# Patient Record
Sex: Female | Born: 1955 | Race: Black or African American | Hispanic: No | Smoking: Current some day smoker
Health system: Southern US, Community
[De-identification: ages and names within clinical notes are randomized; demographics above are authoritative.]

## PROBLEM LIST (undated history)

## (undated) DIAGNOSIS — Z8619 Personal history of other infectious and parasitic diseases: Secondary | ICD-10-CM

## (undated) DIAGNOSIS — Z86010 Personal history of colonic polyps: Secondary | ICD-10-CM

## (undated) DIAGNOSIS — T7840XA Allergy, unspecified, initial encounter: Secondary | ICD-10-CM

## (undated) DIAGNOSIS — M8580 Other specified disorders of bone density and structure, unspecified site: Principal | ICD-10-CM

## (undated) DIAGNOSIS — I10 Essential (primary) hypertension: Secondary | ICD-10-CM

## (undated) DIAGNOSIS — M1990 Unspecified osteoarthritis, unspecified site: Secondary | ICD-10-CM

## (undated) HISTORY — DX: Essential (primary) hypertension: I10

## (undated) HISTORY — DX: Other specified disorders of bone density and structure, unspecified site: M85.80

## (undated) HISTORY — DX: Unspecified osteoarthritis, unspecified site: M19.90

## (undated) HISTORY — DX: Personal history of colonic polyps: Z86.010

## (undated) HISTORY — DX: Allergy, unspecified, initial encounter: T78.40XA

## (undated) HISTORY — DX: Personal history of other infectious and parasitic diseases: Z86.19

---

## 2000-04-03 ENCOUNTER — Emergency Department (HOSPITAL_COMMUNITY): Admission: EM | Admit: 2000-04-03 | Discharge: 2000-04-03 | Payer: Self-pay | Admitting: Emergency Medicine

## 2004-04-29 ENCOUNTER — Emergency Department (HOSPITAL_COMMUNITY): Admission: EM | Admit: 2004-04-29 | Discharge: 2004-04-29 | Payer: Self-pay | Admitting: Emergency Medicine

## 2005-01-14 HISTORY — PX: HAND SURGERY: SHX662

## 2005-05-17 ENCOUNTER — Ambulatory Visit (HOSPITAL_BASED_OUTPATIENT_CLINIC_OR_DEPARTMENT_OTHER): Admission: RE | Admit: 2005-05-17 | Discharge: 2005-05-17 | Payer: Self-pay | Admitting: Orthopedic Surgery

## 2005-07-03 ENCOUNTER — Ambulatory Visit (HOSPITAL_BASED_OUTPATIENT_CLINIC_OR_DEPARTMENT_OTHER): Admission: RE | Admit: 2005-07-03 | Discharge: 2005-07-03 | Payer: Self-pay | Admitting: Orthopedic Surgery

## 2012-01-08 ENCOUNTER — Encounter (HOSPITAL_COMMUNITY): Payer: Self-pay | Admitting: Adult Health

## 2012-01-08 ENCOUNTER — Emergency Department (HOSPITAL_COMMUNITY): Payer: BC Managed Care – PPO

## 2012-01-08 ENCOUNTER — Emergency Department (HOSPITAL_COMMUNITY)
Admission: EM | Admit: 2012-01-08 | Discharge: 2012-01-08 | Disposition: A | Discharge status: Home / Self Care | Payer: BC Managed Care – PPO | Attending: Emergency Medicine | Admitting: Emergency Medicine

## 2012-01-08 DIAGNOSIS — R059 Cough, unspecified: Secondary | ICD-10-CM | POA: Insufficient documentation

## 2012-01-08 DIAGNOSIS — R197 Diarrhea, unspecified: Secondary | ICD-10-CM

## 2012-01-08 DIAGNOSIS — R112 Nausea with vomiting, unspecified: Secondary | ICD-10-CM | POA: Insufficient documentation

## 2012-01-08 DIAGNOSIS — I10 Essential (primary) hypertension: Secondary | ICD-10-CM | POA: Insufficient documentation

## 2012-01-08 DIAGNOSIS — F17200 Nicotine dependence, unspecified, uncomplicated: Secondary | ICD-10-CM | POA: Insufficient documentation

## 2012-01-08 DIAGNOSIS — R509 Fever, unspecified: Secondary | ICD-10-CM | POA: Insufficient documentation

## 2012-01-08 DIAGNOSIS — R05 Cough: Secondary | ICD-10-CM | POA: Insufficient documentation

## 2012-01-08 LAB — CBC WITH DIFFERENTIAL/PLATELET
Basophils Absolute: 0 10*3/uL (ref 0.0–0.1)
Eosinophils Relative: 3 % (ref 0–5)
HCT: 42.1 % (ref 36.0–46.0)
Hemoglobin: 14.2 g/dL (ref 12.0–15.0)
Lymphocytes Relative: 45 % (ref 12–46)
Lymphs Abs: 2.5 10*3/uL (ref 0.7–4.0)
MCV: 92.1 fL (ref 78.0–100.0)
Monocytes Absolute: 0.5 10*3/uL (ref 0.1–1.0)
Monocytes Relative: 8 % (ref 3–12)
Neutro Abs: 2.4 10*3/uL (ref 1.7–7.7)
WBC: 5.5 10*3/uL (ref 4.0–10.5)

## 2012-01-08 LAB — URINALYSIS, ROUTINE W REFLEX MICROSCOPIC
Bilirubin Urine: NEGATIVE
Hgb urine dipstick: NEGATIVE
Ketones, ur: 15 mg/dL — AB
Specific Gravity, Urine: 1.014 (ref 1.005–1.030)
Urobilinogen, UA: 0.2 mg/dL (ref 0.0–1.0)

## 2012-01-08 LAB — BASIC METABOLIC PANEL
BUN: 12 mg/dL (ref 6–23)
CO2: 26 mEq/L (ref 19–32)
Chloride: 100 mEq/L (ref 96–112)
Creatinine, Ser: 0.74 mg/dL (ref 0.50–1.10)
Glucose, Bld: 108 mg/dL — ABNORMAL HIGH (ref 70–99)

## 2012-01-08 LAB — HEPATIC FUNCTION PANEL
ALT: 11 U/L (ref 0–35)
AST: 18 U/L (ref 0–37)
Alkaline Phosphatase: 60 U/L (ref 39–117)
Bilirubin, Direct: <0.1 mg/dL (ref 0.0–0.3)

## 2012-01-08 LAB — POCT I-STAT TROPONIN I

## 2012-01-08 LAB — LIPASE, BLOOD: Lipase: 25 U/L (ref 11–59)

## 2012-01-08 MED ORDER — PROMETHAZINE HCL 25 MG PO TABS: 25.0000 mg | ORAL_TABLET | Freq: Four times a day (QID) | ORAL | Status: DC | PRN

## 2012-01-08 MED ORDER — SODIUM CHLORIDE 0.9 % IV SOLN
1000.0000 mL | Freq: Once | INTRAVENOUS | Status: AC
  Administered 2012-01-08: 1000 mL via INTRAVENOUS

## 2012-01-08 MED ORDER — ONDANSETRON HCL 4 MG/2ML IJ SOLN
4.0000 mg | Freq: Once | INTRAMUSCULAR | Status: AC
  Administered 2012-01-08: 4 mg via INTRAVENOUS
  Filled 2012-01-08: qty 2

## 2012-01-08 MED ORDER — SODIUM CHLORIDE 0.9 % IV SOLN: 1000.0000 mL | INTRAVENOUS | Status: DC

## 2012-01-08 MED ORDER — HYDROCHLOROTHIAZIDE 25 MG PO TABS: 25.0000 mg | ORAL_TABLET | Freq: Every day | ORAL | Status: DC

## 2012-01-08 NOTE — ED Notes (Addendum)
Presents with abdominal pain, diarrhea and vomiting since Friday associated with fatigue and generalized body aches, SOB and headache and chest pain. Chest pain is described as intermittnent and pin sticking located on left side of chest. She is hypertensive 193/90.

## 2012-01-08 NOTE — ED Provider Notes (Signed)
History    CSN: [id number] Arrival date & time 01/08/12  1802 First MD Initiated Contact with Patient 01/08/12 1827      Chief Complaint  Patient presents with  . Diarrhea  . Fever    HPI Comments: It started Friday.  She had an episode or two of vomiting and diarrhea but then vomited about every 20 minutes on Saturday.  The last time she had any diarrhea was Monday.  They last time she vomited was Monday as well.  She still feels nauseated.  She was having pain in her abdomen friday through sunday but that has gotten better.  She has been able to eat a little bit today but her appetite is still not good.  She has had some intermittent sharp pinprick pain in her chest on the left side.  Patient is a 56 y.o. female presenting with diarrhea and fever. The history is provided by the patient.  Diarrhea The primary symptoms include fever and diarrhea. Primary symptoms do not include dysuria.  The illness is also significant for chills.  Fever Primary symptoms of the febrile illness include fever, cough and diarrhea. Primary symptoms do not include dysuria.    History reviewed. No pertinent past medical history. except HTN (she does not have a doctor or take medications)  History reviewed. No pertinent past surgical history.  History reviewed. No pertinent family history.  History  Substance Use Topics  . Smoking status: Current Every Day Smoker  . Smokeless tobacco: Not on file  . Alcohol Use: No    OB History    Grav Para Term Preterm Abortions TAB SAB Ect Mult Living                  Review of Systems  Constitutional: Positive for fever and chills.  Respiratory: Positive for cough.   Gastrointestinal: Positive for diarrhea.  Genitourinary: Negative for dysuria.  All other systems reviewed and are negative.    Allergies  Review of patient's allergies indicates no known allergies.  Home Medications  No current outpatient prescriptions on file.  BP 193/90  Pulse  78  Temp 98.8 F (37.1 C)  Resp 18  SpO2 100%  Physical Exam  Nursing note and vitals reviewed. Constitutional: She appears well-developed and well-nourished. No distress.  HENT:  Head: Normocephalic and atraumatic.  Right Ear: External ear normal.  Left Ear: External ear normal.  Mouth/Throat: No oropharyngeal exudate.  Eyes: Conjunctivae normal are normal. Right eye exhibits no discharge. Left eye exhibits no discharge. No scleral icterus.  Neck: Neck supple. No tracheal deviation present.  Cardiovascular: Normal rate, regular rhythm and intact distal pulses.   Pulmonary/Chest: Effort normal and breath sounds normal. No stridor. No respiratory distress. She has no wheezes. She has no rales.  Abdominal: Soft. Bowel sounds are normal. She exhibits no distension. There is no tenderness. There is no rebound and no guarding.  Musculoskeletal: She exhibits no edema and no tenderness.  Neurological: She is alert. She has normal strength. No sensory deficit. Cranial nerve deficit:  no gross defecits noted. She exhibits normal muscle tone. She displays no seizure activity. Coordination normal.  Skin: Skin is warm and dry. No rash noted.  Psychiatric: She has a normal mood and affect.    ED Course  Procedures (including critical care time)  Medications  0.9 %  sodium chloride infusion (1000 mL Intravenous New Bag/Given 01/08/12 1902)    Followed by  0.9 %  sodium chloride infusion (1000 mL Intravenous New  Bag/Given 01/08/12 1904)    Followed by  0.9 %  sodium chloride infusion (not administered)  Multiple Vitamin (MULTIVITAMIN WITH MINERALS) TABS (not administered)  promethazine (PHENERGAN) 25 MG tablet (not administered)  hydrochlorothiazide (HYDRODIURIL) 25 MG tablet (not administered)  ondansetron (ZOFRAN) injection 4 mg (4 mg Intravenous Given 01/08/12 2021)    Labs Reviewed  BASIC METABOLIC PANEL - Abnormal; Notable for the following:    Glucose, Bld 108 (*)     All other  components within normal limits  URINALYSIS, ROUTINE W REFLEX MICROSCOPIC - Abnormal; Notable for the following:    Ketones, ur 15 (*)     All other components within normal limits  CBC WITH DIFFERENTIAL  LIPASE, BLOOD  HEPATIC FUNCTION PANEL  POCT I-STAT TROPONIN I   Dg Chest 2 View  01/08/2012  *RADIOLOGY REPORT*  Clinical Data: Fever.  Diarrhea.  Chest pain and short of breath  CHEST - 2 VIEW  Comparison:  None.  Findings:  The heart size and mediastinal contours are within normal limits.  Both lungs are clear.  The visualized skeletal structures are unremarkable.  IMPRESSION: No active cardiopulmonary disease.   Original Report Authenticated By: Janeece Riggers, M.D.      1. Diarrhea   2. HTN (hypertension)       MDM  Diarrhea I suspect her symptoms are related to a viral type illness. They have mostly resolved at this point.  She does not have any evidence of dehydration, renal insufficiency, or other abnormalities.  Hypertension The patient's blood pressure is elevated. She does not have a primary care Dr. I will start her on a diuretic but have her start that after her stomach illness has resolved. Patient does have plans to follow up with primary doctor in the next few weeks  At this time there does not appear to be any evidence of an acute emergency medical condition and the patient appears stable for discharge with appropriate outpatient follow up.         Celene Kras, MD 01/08/12 [phone]

## 2012-01-08 NOTE — ED Notes (Signed)
Patient transported to X-ray 

## 2012-01-20 ENCOUNTER — Ambulatory Visit (INDEPENDENT_AMBULATORY_CARE_PROVIDER_SITE_OTHER): Payer: BC Managed Care – PPO | Admitting: Family

## 2012-01-20 ENCOUNTER — Encounter: Payer: Self-pay | Admitting: Family

## 2012-01-20 ENCOUNTER — Ambulatory Visit (HOSPITAL_BASED_OUTPATIENT_CLINIC_OR_DEPARTMENT_OTHER)
Admission: RE | Admit: 2012-01-20 | Discharge: 2012-01-20 | Disposition: A | Discharge status: Home / Self Care | Payer: BC Managed Care – PPO | Source: Ambulatory Visit | Attending: Family | Admitting: Family

## 2012-01-20 ENCOUNTER — Other Ambulatory Visit: Payer: Self-pay | Admitting: Family

## 2012-01-20 VITALS — BP 150/82 | HR 103 | Temp 98.5°F | Resp 16 | Ht 63.0 in | Wt 118.1 lb

## 2012-01-20 DIAGNOSIS — Z0000 Encounter for general adult medical examination without abnormal findings: Secondary | ICD-10-CM

## 2012-01-20 DIAGNOSIS — Z87891 Personal history of nicotine dependence: Secondary | ICD-10-CM | POA: Insufficient documentation

## 2012-01-20 DIAGNOSIS — M79643 Pain in unspecified hand: Secondary | ICD-10-CM

## 2012-01-20 DIAGNOSIS — M79609 Pain in unspecified limb: Secondary | ICD-10-CM

## 2012-01-20 DIAGNOSIS — F17200 Nicotine dependence, unspecified, uncomplicated: Secondary | ICD-10-CM

## 2012-01-20 DIAGNOSIS — Z1231 Encounter for screening mammogram for malignant neoplasm of breast: Secondary | ICD-10-CM

## 2012-01-20 DIAGNOSIS — M2550 Pain in unspecified joint: Secondary | ICD-10-CM

## 2012-01-20 DIAGNOSIS — Z720 Tobacco use: Secondary | ICD-10-CM

## 2012-01-20 DIAGNOSIS — I10 Essential (primary) hypertension: Secondary | ICD-10-CM | POA: Insufficient documentation

## 2012-01-20 LAB — SEDIMENTATION RATE: Sed Rate: 11 mm/hr (ref 0–22)

## 2012-01-20 LAB — BASIC METABOLIC PANEL
CO2: 32 mEq/L (ref 19–32)
Chloride: 101 mEq/L (ref 96–112)
Creat: 0.84 mg/dL (ref 0.50–1.10)
Sodium: 140 mEq/L (ref 135–145)

## 2012-01-20 MED ORDER — LISINOPRIL-HYDROCHLOROTHIAZIDE 10-12.5 MG PO TABS: 1.0000 | ORAL_TABLET | Freq: Every day | ORAL | Status: DC

## 2012-01-20 MED ORDER — VARENICLINE TARTRATE 0.5 MG X 11 & 1 MG X 42 PO MISC: ORAL | Status: DC

## 2012-01-20 NOTE — Addendum Note (Signed)
Addended by: Mervin Kung A on: 01/20/2012 04:08 PM   Modules accepted: Orders

## 2012-01-20 NOTE — Progress Notes (Signed)
Subjective:    Patient ID: Samantha Walker, female    DOB: 01-28-1955, 57 y.o.   MRN: [id number]  HPI  Samantha Walker is a 57 yr old female who presents today to establish care.   Hand pain- Pt reports right hand pain, worse with trying to open a jar. She reports that this mainly bothers her in her hands.  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  Pain worsened 2-3 weeks ago.  HTN- reports that she was diagnosed in her early 70's. She in on HCTZ which she took today.  Tobacco abuse- she has been smoking 1/2 PPD x >20 yrs.  She has quit cold [place] once x 2 weeks.  She has tried the atch but it bothered her skin and she developed itching at the site.      Review of Systems  Constitutional:       Reports that her weight is variable up and down 118-125.  HENT: Negative for hearing loss.   Eyes:       She is overdue for eye exam- wears reading glasses  Respiratory:       Reports + smoker cough  Cardiovascular: Negative for leg swelling.  Gastrointestinal: Negative for nausea, vomiting and diarrhea.  Genitourinary: Negative for dysuria and frequency.  Musculoskeletal: Positive for arthralgias.       + pain in feet/legs at the end of the day from standing.  Skin: Negative for rash.  Neurological:       Reports occasional headaches.  Hematological: Negative for adenopathy.  Psychiatric/Behavioral:       Denies depression.   Some mild anxiety which she attributes to stress   Past Medical History  Diagnosis Date  . Arthritis   . History of chicken pox   . Hypertension     History   Social History  . Marital Status: Married    Spouse Name: N/A    Number of Children: N/A  . Years of Education: N/A   Occupational History  . Not on file.   Social History Main Topics  . Smoking status: Current Every Day Smoker    Types: Cigarettes  . Smokeless tobacco: Not on file     Comment: 8 cigarettes a day  . Alcohol Use: Yes     Comment: occasionally  . Drug Use: Yes   Special: Marijuana  . Sexually Active: Not on file   Other Topics Concern  . Not on file   Social History Narrative   Rare use of marijuanaWorks as cook at [employer] retirement centerShe has 2 grown sons- 4 grandchildrenMarriedCompleted 7th grade.      Past Surgical History  Procedure Date  . Cesarean section 1980  . Hand surgery 2007    carpal tunel release-left hand  . Hand surgery 2007    carpal tunel release--right hand    Family History  Problem Relation Age of Onset  . Arthritis Mother   . Cancer Mother     breast age 62  . Hypertension Mother   . Hypertension Father   . Cancer Sister 13    breast   . Hypertension Sister   . Hypertension Paternal Uncle   . Parkinson's disease Maternal Grandmother     No Known Allergies  Current Outpatient Prescriptions on File Prior to Visit  Medication Sig Dispense Refill  . hydrochlorothiazide (HYDRODIURIL) 25 MG tablet Take 1 tablet (25 mg total) by mouth daily.  30 tablet  0  . lisinopril-hydrochlorothiazide (PRINZIDE,ZESTORETIC)  10-12.5 MG per tablet Take 1 tablet by mouth daily.  30 tablet  1  . promethazine (PHENERGAN) 25 MG tablet Take 1 tablet (25 mg total) by mouth every 6 (six) hours as needed for nausea.  30 tablet  0    BP 150/82  Pulse 103  Temp 98.5 F (36.9 C) (Oral)  Resp 16  Ht 5\' 3"  (1.6 m)  Wt 118 lb 1.9 oz (53.579 kg)  BMI 20.92 kg/m2  SpO2 97%        Objective:   Physical Exam  Constitutional: She is oriented to person, place, and time. She appears well-developed and well-nourished. No distress.       Thin, pleasant, african american female in NAD  HENT:  Head: Normocephalic and atraumatic.  Cardiovascular: Normal rate and regular rhythm.   No murmur heard. Pulmonary/Chest: Effort normal and breath sounds normal. No respiratory distress. She has no wheezes. She has no rales. She exhibits no tenderness.  Musculoskeletal: She exhibits no edema.       + swelling of the right and 1st PIP joint.      Lymphadenopathy:    She has no cervical adenopathy.  Neurological: She is alert and oriented to person, place, and time.  Skin: Skin is warm and dry. No rash noted. No erythema. No pallor.  Psychiatric: She has a normal mood and affect. Her behavior is normal. Judgment and thought content normal.          Assessment & Plan:

## 2012-01-20 NOTE — Assessment & Plan Note (Signed)
Trial of chantix. Common side effects including rare risk of suicide ideation was discussed with the patient today.  Patient is instructed to go directly to the ED if this occurs.  We discussed that patient can continue to smoke for 1 week after starting chantix, but then must discontinue cigarettes.  He is also instructed to contact us prior to completion of the starter month pack for an rx for the continuation month pack.  5 minutes spent with patient today on tobacco cessation counseling.   

## 2012-01-20 NOTE — Assessment & Plan Note (Addendum)
BP is above goal.  Will change hctz to lisinopril hctz. Add ASA 81mg  daily for cardiac protection.

## 2012-01-20 NOTE — Patient Instructions (Addendum)
Please complete your lab work prior to leaving. Please schedule your mammogram on the first floor. Follow up in 1 month for a complete physical. Come fasting to this appointment. Welcome to [facility]!

## 2012-01-20 NOTE — Assessment & Plan Note (Signed)
I suspect OA as cause.  However, will obtain ESR, ANA, RA as well.

## 2012-01-21 ENCOUNTER — Encounter: Payer: Self-pay | Admitting: Family

## 2012-02-18 ENCOUNTER — Encounter: Payer: Self-pay | Admitting: Gastroenterology

## 2012-02-18 ENCOUNTER — Other Ambulatory Visit (HOSPITAL_COMMUNITY)
Admission: RE | Admit: 2012-02-18 | Discharge: 2012-02-18 | Disposition: A | Discharge status: Home / Self Care | Payer: BC Managed Care – PPO | Source: Ambulatory Visit | Attending: Family | Admitting: Family

## 2012-02-18 ENCOUNTER — Encounter: Payer: Self-pay | Admitting: Family

## 2012-02-18 ENCOUNTER — Ambulatory Visit (INDEPENDENT_AMBULATORY_CARE_PROVIDER_SITE_OTHER): Payer: BC Managed Care – PPO | Admitting: Family

## 2012-02-18 VITALS — BP 110/70 | HR 81 | Temp 98.5°F | Resp 16 | Ht 63.0 in | Wt 121.0 lb

## 2012-02-18 DIAGNOSIS — F17200 Nicotine dependence, unspecified, uncomplicated: Secondary | ICD-10-CM

## 2012-02-18 DIAGNOSIS — M79643 Pain in unspecified hand: Secondary | ICD-10-CM

## 2012-02-18 DIAGNOSIS — I10 Essential (primary) hypertension: Secondary | ICD-10-CM

## 2012-02-18 DIAGNOSIS — Z0000 Encounter for general adult medical examination without abnormal findings: Secondary | ICD-10-CM

## 2012-02-18 DIAGNOSIS — M79609 Pain in unspecified limb: Secondary | ICD-10-CM

## 2012-02-18 DIAGNOSIS — Z01419 Encounter for gynecological examination (general) (routine) without abnormal findings: Secondary | ICD-10-CM | POA: Insufficient documentation

## 2012-02-18 DIAGNOSIS — M79644 Pain in right finger(s): Secondary | ICD-10-CM

## 2012-02-18 DIAGNOSIS — Z720 Tobacco use: Secondary | ICD-10-CM

## 2012-02-18 MED ORDER — VARENICLINE TARTRATE 1 MG PO TABS: 1.0000 mg | ORAL_TABLET | Freq: Two times a day (BID) | ORAL | Status: DC

## 2012-02-18 NOTE — Assessment & Plan Note (Signed)
I applauded her success this far, continue chantix.

## 2012-02-18 NOTE — Patient Instructions (Addendum)
You will be contacted about your referral to the hand specialist and for colonoscopy.  Please let us know if you have not heard back within 1 week about your referral. Schedule bone density at the front desk. Complete your lab work prior to leaving.   Follow up in 3 months.

## 2012-02-18 NOTE — Assessment & Plan Note (Signed)
BP is improved.  Continue lisinopril hctz.  Obtain follow up bmet.

## 2012-02-18 NOTE — Assessment & Plan Note (Signed)
Continue healthy diet, exercise, refer for col/dexa.  Pap performed today.  Immunizations reviewed and up to date.

## 2012-02-18 NOTE — Addendum Note (Signed)
Addended by: Mervin Kung A on: 02/18/2012 12:00 PM   Modules accepted: Orders

## 2012-02-18 NOTE — Progress Notes (Signed)
Subjective:    Patient ID: Samantha Walker, female    DOB: 1955-04-23, 57 y.o.   MRN: [id number]  HPI  Samantha Walker is a 57 yr old female who presents today for CPX.  Immunizations: reports tetanus up to date. Flu shot up to date.  ([facility]) Diet: trying to eat healthy.  Exercise: occasional exercise bike.  Colonoscopy: Dexa: due Pap Smear: Mammogram:01/20/12  HTN-  Last visit lisinopril added to regimen.  Tolerating without cough.    Tobacco abuse- on chantix.  Last cigarette 20 days ago.    Review of Systems  Constitutional: Negative for unexpected weight change.  HENT: Negative for hearing loss.   Eyes: Negative for visual disturbance.  Respiratory: Negative for cough.   Cardiovascular: Negative for leg swelling.  Gastrointestinal: Negative for blood in stool.  Genitourinary: Negative for dysuria.  Musculoskeletal: Negative for back pain.       Pain, swelling right index finger, stiff  Skin: Negative for rash.  Neurological: Negative for headaches.  Hematological: Negative for adenopathy.  Psychiatric/Behavioral:       Reports + stress   Past Medical History  Diagnosis Date  . Arthritis   . History of chicken pox   . Hypertension     History   Social History  . Marital Status: Married    Spouse Name: N/A    Number of Children: N/A  . Years of Education: N/A   Occupational History  . Not on file.   Social History Main Topics  . Smoking status: Former Smoker    Types: Cigarettes    Quit date: 01/27/2012  . Smokeless tobacco: Never Used     Comment: 8 cigarettes a day  . Alcohol Use: Yes     Comment: occasionally  . Drug Use: Yes    Special: Marijuana  . Sexually Active: Not on file   Other Topics Concern  . Not on file   Social History Narrative   Rare use of marijuanaWorks as cook at [employer] retirement centerShe has 2 grown sons- 4 grandchildrenMarriedCompleted 7th grade.      Past Surgical History  Procedure Date  . Cesarean section 1980  . Hand  surgery 2007    carpal tunel release-left hand  . Hand surgery 2007    carpal tunel release--right hand    Family History  Problem Relation Age of Onset  . Arthritis Mother   . Cancer Mother     breast age 79  . Hypertension Mother   . Hypertension Father   . Cancer Sister 43    breast   . Hypertension Sister   . Hypertension Paternal Uncle   . Parkinson's disease Maternal Grandmother     No Known Allergies  Current Outpatient Prescriptions on File Prior to Visit  Medication Sig Dispense Refill  . aspirin 81 MG tablet Take 81 mg by mouth daily.      Marland Kitchen lisinopril-hydrochlorothiazide (PRINZIDE,ZESTORETIC) 10-12.5 MG per tablet Take 1 tablet by mouth daily.  30 tablet  1  . Multiple Vitamins-Calcium (ONE-A-DAY WOMENS FORMULA PO) Take 1 tablet by mouth daily.        BP 110/70  Pulse 81  Temp 98.5 F (36.9 C) (Oral)  Resp 16  Ht 5\' 3"  (1.6 m)  Wt 121 lb (54.885 kg)  BMI 21.43 kg/m2  SpO2 99%       Objective:   Physical Exam  Physical Exam  Constitutional: Samantha Walker is oriented to person, place, and time. Samantha Walker appears well-developed and well-nourished. No  distress.  HENT:  Head: Normocephalic and atraumatic.  Right Ear: Tympanic membrane and ear canal normal.  Left Ear: Tympanic membrane and ear canal normal.  Mouth/Throat: Oropharynx is clear and moist.  Eyes: Pupils are equal, round, and reactive to light. No scleral icterus.  Neck: Normal range of motion. No thyromegaly present.  Cardiovascular: Normal rate and regular rhythm.   No murmur heard. Pulmonary/Chest: Effort normal and breath sounds normal. No respiratory distress. He has no wheezes. Samantha Walker has no rales. Samantha Walker exhibits no tenderness.  Abdominal: Soft. Bowel sounds are normal. He exhibits no distension and no mass. There is no tenderness. There is no rebound and no guarding.  Musculoskeletal: Samantha Walker exhibits no edema.  Lymphadenopathy:    Samantha Walker has no cervical adenopathy.  Neurological: Samantha Walker is alert and oriented to  person, place, and time. Samantha Walker has normal patellar reflexes. Samantha Walker exhibits normal muscle tone. Coordination normal.  Skin: Skin is warm and dry.  Psychiatric: Samantha Walker has a normal mood and affect. Her behavior is normal. Judgment and thought content normal.  Breasts: Examined lying Right: Without masses, retractions, discharge or axillary adenopathy.  Left: Without masses, retractions, discharge or axillary adenopathy.  Inguinal/mons: Normal without inguinal adenopathy  External genitalia: Normal  BUS/Urethra/Skene's glands: Normal  Bladder: Normal  Vagina: Normal  Cervix: Normal  Uterus: normal in size, shape and contour. Midline and mobile  Adnexa/parametria:  Rt: Without masses or tenderness.  Lt: Without masses or tenderness.  Anus and perineum: Normal           Assessment & Plan:         Assessment & Plan:

## 2012-02-18 NOTE — Assessment & Plan Note (Signed)
ESR, ANA, Sed rate normal. Refer to hand specialist for further evaluation.

## 2012-02-19 ENCOUNTER — Other Ambulatory Visit: Payer: Self-pay | Admitting: Family

## 2012-02-19 DIAGNOSIS — R739 Hyperglycemia, unspecified: Secondary | ICD-10-CM

## 2012-02-19 LAB — URINALYSIS, ROUTINE W REFLEX MICROSCOPIC
Ketones, ur: NEGATIVE mg/dL
Leukocytes, UA: NEGATIVE
Nitrite: NEGATIVE
Specific Gravity, Urine: 1.014 (ref 1.005–1.030)
Urobilinogen, UA: 0.2 mg/dL (ref 0.0–1.0)

## 2012-02-20 ENCOUNTER — Encounter: Payer: Self-pay | Admitting: Family

## 2012-02-20 LAB — CBC WITH DIFFERENTIAL/PLATELET
Basophils Absolute: 0 10*3/uL (ref 0.0–0.1)
Basophils Relative: 0 % (ref 0–1)
Hemoglobin: 12.3 g/dL (ref 12.0–15.0)
MCHC: 33.6 g/dL (ref 30.0–36.0)
Monocytes Relative: 7 % (ref 3–12)
Neutro Abs: 2.5 10*3/uL (ref 1.7–7.7)
Neutrophils Relative %: 47 % (ref 43–77)
RDW: 13.9 % (ref 11.5–15.5)

## 2012-02-20 LAB — URINALYSIS, ROUTINE W REFLEX MICROSCOPIC
Bilirubin Urine: NEGATIVE
Glucose, UA: NEGATIVE mg/dL
Hgb urine dipstick: NEGATIVE
Protein, ur: NEGATIVE mg/dL

## 2012-02-20 LAB — LIPID PANEL
Cholesterol: 194 mg/dL (ref 0–200)
Triglycerides: 86 mg/dL (ref <150)

## 2012-02-20 LAB — HEPATIC FUNCTION PANEL
ALT: 12 U/L (ref 0–35)
AST: 19 U/L (ref 0–37)
Alkaline Phosphatase: 61 U/L (ref 39–117)
Indirect Bilirubin: 0.5 mg/dL (ref 0.0–0.9)
Total Protein: 7.1 g/dL (ref 6.0–8.3)

## 2012-02-20 LAB — BASIC METABOLIC PANEL WITH GFR
BUN: 11 mg/dL (ref 6–23)
GFR, Est African American: >89 mL/min
GFR, Est Non African American: 81 mL/min
Potassium: 3.6 mEq/L (ref 3.5–5.3)

## 2012-02-22 ENCOUNTER — Encounter: Payer: Self-pay | Admitting: Family

## 2012-03-03 ENCOUNTER — Other Ambulatory Visit: Payer: BC Managed Care – PPO

## 2012-03-11 ENCOUNTER — Telehealth: Payer: Self-pay | Admitting: *Deleted

## 2012-03-11 DIAGNOSIS — R739 Hyperglycemia, unspecified: Secondary | ICD-10-CM | POA: Insufficient documentation

## 2012-03-11 NOTE — Telephone Encounter (Signed)
Message copied by Elnora Morrison on Wed Mar 11, 2012  3:58 PM ------      Message from: Sandford Craze      Created: Sat Feb 22, 2012 11:06 AM             Sodium is slightly low- no change in medication at this time.  Sugar is mildly elevated, blood count normal. Cholesterol liver, thyroid, urine are normal. I would like her to return to the lab for A1C. Diagnosis is hyperglycemia. ------

## 2012-03-11 NOTE — Addendum Note (Signed)
Addended by: Elnora Morrison on: 03/11/2012 04:08 PM   Modules accepted: Orders

## 2012-03-11 NOTE — Telephone Encounter (Signed)
PATIENT NOTIFIED OF LAB RESULTS AND THE NEED TO RETURN TO OFFICE FOR HBA1C.

## 2012-03-18 ENCOUNTER — Ambulatory Visit (INDEPENDENT_AMBULATORY_CARE_PROVIDER_SITE_OTHER)
Admission: RE | Admit: 2012-03-18 | Discharge: 2012-03-18 | Disposition: A | Discharge status: Home / Self Care | Payer: BC Managed Care – PPO | Source: Ambulatory Visit | Attending: Family | Admitting: Family

## 2012-03-18 DIAGNOSIS — Z0000 Encounter for general adult medical examination without abnormal findings: Secondary | ICD-10-CM

## 2012-03-23 ENCOUNTER — Other Ambulatory Visit: Payer: BC Managed Care – PPO | Admitting: Gastroenterology

## 2012-03-26 ENCOUNTER — Encounter: Payer: Self-pay | Admitting: Family

## 2012-03-26 ENCOUNTER — Ambulatory Visit (INDEPENDENT_AMBULATORY_CARE_PROVIDER_SITE_OTHER): Payer: BC Managed Care – PPO | Admitting: Family

## 2012-03-26 VITALS — BP 136/74 | HR 88 | Temp 98.0°F | Resp 16 | Ht 63.0 in | Wt 126.0 lb

## 2012-03-26 DIAGNOSIS — N898 Other specified noninflammatory disorders of vagina: Secondary | ICD-10-CM

## 2012-03-26 DIAGNOSIS — N390 Urinary tract infection, site not specified: Secondary | ICD-10-CM

## 2012-03-26 DIAGNOSIS — N760 Acute vaginitis: Secondary | ICD-10-CM

## 2012-03-26 DIAGNOSIS — B3749 Other urogenital candidiasis: Secondary | ICD-10-CM

## 2012-03-26 LAB — POCT URINALYSIS DIPSTICK
Bilirubin, UA: NEGATIVE
Ketones, UA: NEGATIVE
pH, UA: 6

## 2012-03-26 NOTE — Patient Instructions (Addendum)
We will contact you with your results.  

## 2012-03-26 NOTE — Progress Notes (Signed)
  Subjective:    Patient ID: Samantha Walker, female    DOB: 27-Aug-1955, 57 y.o.   MRN: [id number]  HPI  Samantha Walker is a 57 yr old female who presents today with chief complaint of vaginal discharge. Denies associated abdominal pain or fever.  She reports mild vaginal discomfort.  Feels "a little swollen." denies dysuria. Denies new sexual partners.    Review of Systems See HPI  Past Medical History  Diagnosis Date  . Arthritis   . History of chicken pox   . Hypertension     History   Social History  . Marital Status: Married    Spouse Name: N/A    Number of Children: N/A  . Years of Education: N/A   Occupational History  . Not on file.   Social History Main Topics  . Smoking status: Former Smoker    Types: Cigarettes    Quit date: 01/27/2012  . Smokeless tobacco: Never Used     Comment: 8 cigarettes a day  . Alcohol Use: Yes     Comment: occasionally  . Drug Use: Yes    Special: Marijuana  . Sexually Active: Not on file   Other Topics Concern  . 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            Past Surgical History  Procedure Laterality Date  . Cesarean section  1980  . Hand surgery  2007    carpal tunel release-left hand  . Hand surgery  2007    carpal tunel release--right hand    Family History  Problem Relation Age of Onset  . Arthritis Mother   . Cancer Mother     breast age 50  . Hypertension Mother   . Hypertension Father   . Cancer Sister 7    breast   . Hypertension Sister   . Hypertension Paternal Uncle   . Parkinson's disease Maternal Grandmother     No Known Allergies  Current Outpatient Prescriptions on File Prior to Visit  Medication Sig Dispense Refill  . aspirin 81 MG tablet Take 81 mg by mouth daily.      Marland Kitchen lisinopril-hydrochlorothiazide (PRINZIDE,ZESTORETIC) 10-12.5 MG per tablet Take  1 tablet by mouth daily.  30 tablet  1  . Multiple Vitamins-Calcium (ONE-A-DAY WOMENS FORMULA PO) Take 1 tablet by mouth daily.      . promethazine (PHENERGAN) 25 MG tablet Take 25 mg by mouth as needed.      . varenicline (CHANTIX CONTINUING MONTH PAK) 1 MG tablet Take 1 tablet (1 mg total) by mouth 2 (two) times daily.  60 tablet  1   No current facility-administered medications on file prior to visit.    BP 136/74  Pulse 88  Temp(Src) 98 F (36.7 C) (Oral)  Resp 16  Ht 5\' 3"  (1.6 m)  Wt 126 lb (57.153 kg)  BMI 22.33 kg/m2  SpO2 99%       Objective:   Physical Exam  Constitutional: She appears well-developed and well-nourished. No distress.  Genitourinary: Vagina normal. No vaginal discharge found.  Psychiatric: She has a normal mood and affect. Her behavior is normal. Judgment and thought content normal.          Assessment & Plan:

## 2012-03-27 ENCOUNTER — Telehealth: Payer: Self-pay | Admitting: Family

## 2012-03-27 LAB — URINE CULTURE: Organism ID, Bacteria: NO GROWTH

## 2012-03-27 MED ORDER — METRONIDAZOLE 500 MG PO TABS: 500.0000 mg | ORAL_TABLET | Freq: Two times a day (BID) | ORAL | Status: DC

## 2012-03-27 NOTE — Telephone Encounter (Signed)
Wet prep + for trich and BV. Advised pt to start metronidazole 500mg  bid x 7 days with no ETOH during rx.  Will also treat her husband who is a patient of mine.

## 2012-03-29 DIAGNOSIS — N760 Acute vaginitis: Secondary | ICD-10-CM | POA: Insufficient documentation

## 2012-03-29 NOTE — Assessment & Plan Note (Signed)
Wet prep not BV and Trich  Rx with metronidazole bid x 1week. Partner treated also. See phone note.

## 2012-04-24 ENCOUNTER — Telehealth: Payer: Self-pay | Admitting: *Deleted

## 2012-04-24 MED ORDER — LISINOPRIL-HYDROCHLOROTHIAZIDE 10-12.5 MG PO TABS: 1.0000 | ORAL_TABLET | Freq: Every day | ORAL | Status: DC

## 2012-04-24 NOTE — Telephone Encounter (Signed)
Pt left message requesting refill of lisinopril-hct. Refill sent to [facility], #30 x 1 refill. Notified pt of completion.

## 2012-05-20 ENCOUNTER — Encounter: Payer: Self-pay | Admitting: Family

## 2012-05-20 ENCOUNTER — Ambulatory Visit (INDEPENDENT_AMBULATORY_CARE_PROVIDER_SITE_OTHER): Payer: BC Managed Care – PPO | Admitting: Family

## 2012-05-20 VITALS — BP 138/80 | HR 79 | Temp 97.8°F | Resp 16 | Ht 63.0 in | Wt 128.0 lb

## 2012-05-20 DIAGNOSIS — I10 Essential (primary) hypertension: Secondary | ICD-10-CM

## 2012-05-20 DIAGNOSIS — F17200 Nicotine dependence, unspecified, uncomplicated: Secondary | ICD-10-CM

## 2012-05-20 DIAGNOSIS — R7309 Other abnormal glucose: Secondary | ICD-10-CM

## 2012-05-20 DIAGNOSIS — R739 Hyperglycemia, unspecified: Secondary | ICD-10-CM

## 2012-05-20 DIAGNOSIS — Z720 Tobacco use: Secondary | ICD-10-CM

## 2012-05-20 LAB — BASIC METABOLIC PANEL
Glucose, Bld: 87 mg/dL (ref 70–99)
Potassium: 4 mEq/L (ref 3.5–5.3)
Sodium: 142 mEq/L (ref 135–145)

## 2012-05-20 LAB — HEMOGLOBIN A1C: Hgb A1c MFr Bld: 5.1 % (ref <5.7)

## 2012-05-20 NOTE — Assessment & Plan Note (Signed)
She has quit. I commended her efforts!

## 2012-05-20 NOTE — Patient Instructions (Addendum)
Please schedule a follow up appointment in 3 months.

## 2012-05-20 NOTE — Assessment & Plan Note (Signed)
BP stable on lisinopril/hctz, continue same, obtain bmet.

## 2012-05-20 NOTE — Assessment & Plan Note (Addendum)
New.  Check A1C.

## 2012-05-20 NOTE — Progress Notes (Signed)
Subjective:    Patient ID: Samantha Walker, female    DOB: [birth year], 56 y.o.   MRN: [id number]  HPI  Ms.  Walker is a 57 yr old female who presents today for follow up.  1) HTN-  Currently maintained on lisinopril/hctz.    2) Hyperglycemia- last visit A1C was 134  3) Tobacco abuse- She continues chantix.  Has not smoked since 1/16.      Review of Systems See HPI  Past Medical History  Diagnosis Date  . Arthritis   . History of chicken pox   . Hypertension     History   Social History  . Marital Status: Married    Spouse Name: N/A    Number of Children: N/A  . Years of Education: N/A   Occupational History  . Not on file.   Social History Main Topics  . Smoking status: Former Smoker    Types: Cigarettes    Quit date: 01/27/2012  . Smokeless tobacco: Never Used     Comment: 8 cigarettes a day  . Alcohol Use: Yes     Comment: occasionally  . Drug Use: Yes    Special: Marijuana  . Sexually Active: Not on file   Other Topics Concern  . 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            Past Surgical History  Procedure Laterality Date  . Cesarean section  1980  . Hand surgery  2007    carpal tunel release-left hand  . Hand surgery  2007    carpal tunel release--right hand    Family History  Problem Relation Age of Onset  . Arthritis Mother   . Cancer Mother     breast age 41  . Hypertension Mother   . Hypertension Father   . Cancer Sister 84    breast   . Hypertension Sister   . Hypertension Paternal Uncle   . Parkinson's disease Maternal Grandmother     No Known Allergies  Current Outpatient Prescriptions on File Prior to Visit  Medication Sig Dispense Refill  . aspirin 81 MG tablet Take 81 mg by mouth daily.      Marland Kitchen lisinopril-hydrochlorothiazide (PRINZIDE,ZESTORETIC) 10-12.5 MG per tablet Take 1  tablet by mouth daily.  30 tablet  1  . Multiple Vitamins-Calcium (ONE-A-DAY WOMENS FORMULA PO) Take 1 tablet by mouth daily.      . promethazine (PHENERGAN) 25 MG tablet Take 25 mg by mouth as needed.      . varenicline (CHANTIX CONTINUING MONTH PAK) 1 MG tablet Take 1 tablet (1 mg total) by mouth 2 (two) times daily.  60 tablet  1   No current facility-administered medications on file prior to visit.    BP 138/80  Pulse 79  Temp(Src) 97.8 F (36.6 C) (Oral)  Resp 16  Ht 5\' 3"  (1.6 m)  Wt 128 lb (58.06 kg)  BMI 22.68 kg/m2  SpO2 99%       Objective:   Physical Exam  Constitutional: She is oriented to person, place, and time. She appears well-developed and well-nourished. No distress.  Cardiovascular: Normal rate and regular rhythm.   No murmur heard. Pulmonary/Chest: Effort normal and breath sounds normal. No respiratory distress. She has no wheezes. She has no rales. She exhibits no tenderness.  Neurological: She is alert and oriented to person, place, and time.  Psychiatric: She has a normal mood and affect. Her behavior is normal. Judgment and thought content normal.          Assessment & Plan:

## 2012-06-05 ENCOUNTER — Telehealth: Payer: Self-pay | Admitting: *Deleted

## 2012-06-05 NOTE — Telephone Encounter (Signed)
Message copied by Kathi Simpers on Fri Jun 05, 2012  4:38 PM ------      Message from: O'SULLIVAN, MELISSA      Created: Tue May 26, 2012  9:11 PM       OK, could you pls call pt and let her know that her calcium is slightly elevated.  I would like her to return for those tests please. ------

## 2012-06-05 NOTE — Telephone Encounter (Signed)
Notes Recorded by Kathi Simpers, CMA on 06/05/2012 at 4:37 PM Home # now out of service. Mailed letter. Notes Recorded by Kathi Simpers, CMA on 06/04/2012 at 10:30 AM Left message on voicemail to return my call. Notes Recorded by Sandford Craze, NP on 05/26/2012 at 9:11 PM OK, could you pls call pt and let her know that her calcium is slightly elevated. I would like her to return for those tests please. Notes Recorded by Kathi Simpers, CMA on 05/26/2012 at 5:41 PM Lab will be unable to add PTH and ionized calcium as specimen will have to be frozen. Notes Recorded by Sandford Craze, NP on 05/21/2012 at 9:11 PM Can lab add on intact PTH and ionized calcium please? Dx hypercalcemia.

## 2012-06-30 ENCOUNTER — Telehealth: Payer: Self-pay | Admitting: Family

## 2012-06-30 ENCOUNTER — Encounter: Payer: Self-pay | Admitting: Family

## 2012-06-30 DIAGNOSIS — M8580 Other specified disorders of bone density and structure, unspecified site: Secondary | ICD-10-CM | POA: Insufficient documentation

## 2012-06-30 HISTORY — DX: Other specified disorders of bone density and structure, unspecified site: M85.80

## 2012-06-30 MED ORDER — CALCIUM CARBONATE-VITAMIN D 600-400 MG-UNIT PO TABS: 1.0000 | ORAL_TABLET | Freq: Every day | ORAL | Status: AC

## 2012-06-30 NOTE — Telephone Encounter (Signed)
Pls call pt and let her know that her bone density shows mild bone thinning.  I would like her to add caltrate 600mg  + D bid and make sure to get regular weight bearing exercise.

## 2012-07-01 NOTE — Telephone Encounter (Signed)
Tried calling pt # on file is no longer a working #. Mailing pt letter with md response concerning her bone density...Samantha Walker

## 2012-08-18 ENCOUNTER — Ambulatory Visit: Payer: BC Managed Care – PPO | Admitting: Family

## 2012-08-21 ENCOUNTER — Encounter: Payer: Self-pay | Admitting: Family

## 2012-08-21 ENCOUNTER — Ambulatory Visit (INDEPENDENT_AMBULATORY_CARE_PROVIDER_SITE_OTHER): Payer: BC Managed Care – PPO | Admitting: Family

## 2012-08-21 DIAGNOSIS — Z87891 Personal history of nicotine dependence: Secondary | ICD-10-CM

## 2012-08-21 DIAGNOSIS — I10 Essential (primary) hypertension: Secondary | ICD-10-CM

## 2012-08-21 DIAGNOSIS — R7309 Other abnormal glucose: Secondary | ICD-10-CM

## 2012-08-21 DIAGNOSIS — R739 Hyperglycemia, unspecified: Secondary | ICD-10-CM

## 2012-08-21 LAB — CALCIUM, IONIZED: Calcium, Ion: 1.34 mmol/L — ABNORMAL HIGH (ref 1.12–1.32)

## 2012-08-21 MED ORDER — LISINOPRIL-HYDROCHLOROTHIAZIDE 10-12.5 MG PO TABS: 1.0000 | ORAL_TABLET | Freq: Every day | ORAL | Status: DC

## 2012-08-21 NOTE — Assessment & Plan Note (Signed)
A1C 5.1.  Discussed healthy diet and exercise.  Monitor.

## 2012-08-21 NOTE — Assessment & Plan Note (Signed)
Blood pressure is up today but she has not taken her AM med.  I advised her to continue current meds, take dose prior to next appointment.  BP Readings from Last 3 Encounters:  08/21/12 150/86  05/20/12 138/80  03/26/12 136/74

## 2012-08-21 NOTE — Patient Instructions (Addendum)
Please complete your lab work prior to leaving. Follow up in 3 months.   

## 2012-08-21 NOTE — Progress Notes (Signed)
Subjective:    Patient ID: Samantha Walker, female    DOB: [birth year], 56 y.o.   MRN: [id number]  HPI  [name] is a 57 yr old female who presents today for follow up.  1) HTN- currently maintained on lisinopril-hctz. Has not taken medication this AM.   2) Hyperglycemia-last A1C 5.1.   3) Tobacco abuse- quit in January. She is off of chantix. Still quit.     4) hypercalcemia-  Noted last visit.    Review of Systems  Respiratory: Negative for shortness of breath.   Cardiovascular: Negative for chest pain and leg swelling.   See HPI Past Medical History  Diagnosis Date  . Arthritis   . History of chicken pox   . Hypertension   . Osteopenia 06/30/2012    History   Social History  . Marital Status: Married    Spouse Name: N/A    Number of Children: N/A  . Years of Education: N/A   Occupational History  . Not on file.   Social History Main Topics  . Smoking status: Former Smoker    Types: Cigarettes    Quit date: 01/27/2012  . Smokeless tobacco: Never Used     Comment: 8 cigarettes a day  . Alcohol Use: Yes     Comment: occasionally  . Drug Use: Yes    Special: Marijuana  . Sexually Active: Not on file   Other Topics Concern  . 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            Past Surgical History  Procedure Laterality Date  . Cesarean section  1980  . Hand surgery  2007    carpal tunel release-left hand  . Hand surgery  2007    carpal tunel release--right hand    Family History  Problem Relation Age of Onset  . Arthritis Mother   . Cancer Mother     breast age 53  . Hypertension Mother   . Hypertension Father   . Cancer Sister 98    breast   . Hypertension Sister   . Hypertension Paternal Uncle   . Parkinson's disease Maternal Grandmother     No Known Allergies  Current Outpatient Prescriptions on  File Prior to Visit  Medication Sig Dispense Refill  . aspirin 81 MG tablet Take 81 mg by mouth daily.      . Calcium Carbonate-Vitamin D 600-400 MG-UNIT per tablet Take 1 tablet by mouth daily.      Marland Kitchen lisinopril-hydrochlorothiazide (PRINZIDE,ZESTORETIC) 10-12.5 MG per tablet Take 1 tablet by mouth daily.  30 tablet  1  . Multiple Vitamins-Calcium (ONE-A-DAY WOMENS FORMULA PO) Take 1 tablet by mouth daily.      . promethazine (PHENERGAN) 25 MG tablet Take 25 mg by mouth as needed.       No current facility-administered medications on file prior to visit.    BP 150/86  Pulse 60  Temp(Src) 97.6 F (36.4 C) (Oral)  Resp 16  Wt 129 lb 1.3 oz (58.55 kg)  BMI 22.87 kg/m2  SpO2 98%       Objective:   Physical Exam  Constitutional: She is oriented to person, place, and time. She appears well-developed and well-nourished. No distress.  Cardiovascular: Normal rate and regular rhythm.   No murmur heard. Pulmonary/Chest: Effort  normal and breath sounds normal. No respiratory distress. She has no wheezes. She has no rales. She exhibits no tenderness.  Neurological: She is alert and oriented to person, place, and time.  Psychiatric: She has a normal mood and affect. Her behavior is normal. Judgment and thought content normal.          Assessment & Plan:

## 2012-08-21 NOTE — Assessment & Plan Note (Signed)
I commended her on quitting smoking.

## 2012-08-24 ENCOUNTER — Telehealth: Payer: Self-pay | Admitting: Family

## 2012-08-24 LAB — PARATHYROID HORMONE, INTACT (NO CA): PTH: 41.7 pg/mL (ref 14.0–72.0)

## 2012-08-24 NOTE — Telephone Encounter (Signed)
Please call pt and let her know that her calcium is mildly elevated. I would like her to return for some additional lab work as below.

## 2012-08-25 NOTE — Telephone Encounter (Signed)
Left message on home # to return my call. 

## 2012-08-25 NOTE — Telephone Encounter (Signed)
Pt left message returning my call and states it is ok to leave a detailed on her voicemail. Attempted to reach pt and left detailed message to return to the lab for additional test to evaluate mildly elevated calcium letter and to call if any questions. Lab order completed.

## 2012-08-27 ENCOUNTER — Other Ambulatory Visit: Payer: Self-pay | Admitting: Family

## 2012-08-31 LAB — PROTEIN ELECTROPHORESIS, SERUM
Alpha-2-Globulin: 8.5 % (ref 7.1–11.8)
Total Protein, Serum Electrophoresis: 7.3 g/dL (ref 6.0–8.3)

## 2012-09-04 LAB — IMMUNOFIXATION ELECTROPHORESIS
IgG (Immunoglobin G), Serum: 1120 mg/dL (ref 690–1700)
Total Protein, Serum Electrophoresis: 7.6 g/dL (ref 6.0–8.3)

## 2012-09-07 ENCOUNTER — Telehealth: Payer: Self-pay | Admitting: Family

## 2012-09-07 DIAGNOSIS — R771 Abnormality of globulin: Secondary | ICD-10-CM

## 2012-09-07 NOTE — Telephone Encounter (Signed)
Given abnormal globulin and hypercalcemia- will refer to hematology for further evaluation.  Left message requesting that pt return our call.  Order pended below.

## 2012-09-08 NOTE — Telephone Encounter (Signed)
Pt returned my call and left message that it is ok to leave detailed message on home voicemail.  Left detailed message to call and let us know if she wants to proceed with referral.

## 2012-09-17 NOTE — Telephone Encounter (Signed)
Left message for pt to call with decision re: referral below.

## 2012-09-21 NOTE — Telephone Encounter (Signed)
Pt returned my call and wishes to proceed with referral. Also requests refill of BP medication. Pt gave verbal ok to leave detailed message on voicemail.  Refills were sent on 08/21/12, #30 x 5 refills. Left detailed message on pt's voicemail re: refills and to contact her pharmacy.

## 2012-09-22 ENCOUNTER — Telehealth: Payer: Self-pay | Admitting: Family

## 2012-09-22 ENCOUNTER — Telehealth: Payer: Self-pay | Admitting: Hematology & Oncology

## 2012-09-22 NOTE — Telephone Encounter (Signed)
Message copied by Sandford Craze on Tue Sep 22, 2012 12:23 PM ------      Message from: Eulah Pont      Created: Tue Sep 22, 2012 11:47 AM       Call from [facility] @ Dr Myna Hidalgo,   Dr Myna Hidalgo looked at pt's notes  York Spaniel he did not need to see pt, you can call if you have questions ------

## 2012-09-22 NOTE — Telephone Encounter (Signed)
Helen at referring aware Dr. Myna Hidalgo feels he doesn't need to see patient but would be happy to talk to NP about it.

## 2012-11-18 ENCOUNTER — Encounter: Payer: Self-pay | Admitting: Family

## 2012-11-18 ENCOUNTER — Ambulatory Visit (INDEPENDENT_AMBULATORY_CARE_PROVIDER_SITE_OTHER): Payer: BC Managed Care – PPO | Admitting: Family

## 2012-11-18 VITALS — BP 120/66 | HR 67 | Temp 98.4°F | Resp 16 | Ht 63.0 in | Wt 127.0 lb

## 2012-11-18 DIAGNOSIS — R739 Hyperglycemia, unspecified: Secondary | ICD-10-CM

## 2012-11-18 DIAGNOSIS — M899 Disorder of bone, unspecified: Secondary | ICD-10-CM

## 2012-11-18 DIAGNOSIS — M8580 Other specified disorders of bone density and structure, unspecified site: Secondary | ICD-10-CM

## 2012-11-18 DIAGNOSIS — I10 Essential (primary) hypertension: Secondary | ICD-10-CM

## 2012-11-18 DIAGNOSIS — R7309 Other abnormal glucose: Secondary | ICD-10-CM

## 2012-11-18 LAB — BASIC METABOLIC PANEL
BUN: 13 mg/dL (ref 6–23)
CO2: 30 mEq/L (ref 19–32)
Chloride: 106 mEq/L (ref 96–112)
Glucose, Bld: 73 mg/dL (ref 70–99)
Potassium: 3.9 mEq/L (ref 3.5–5.3)
Sodium: 139 mEq/L (ref 135–145)

## 2012-11-18 MED ORDER — LISINOPRIL-HYDROCHLOROTHIAZIDE 10-12.5 MG PO TABS: 1.0000 | ORAL_TABLET | Freq: Every day | ORAL | Status: DC

## 2012-11-18 NOTE — Assessment & Plan Note (Signed)
Clinically stable.  Obtain A1C.  

## 2012-11-18 NOTE — Assessment & Plan Note (Signed)
Stable. Continue caltrate.

## 2012-11-18 NOTE — Progress Notes (Signed)
Subjective:    Patient ID: Samantha Walker, female    DOB: 30-Oct-1955, 57 y.o.   MRN: [id number]  HPI  Samantha Walker is a 57 yr old female who presents today for follow up of multiple medical problems.  1) HTN-  Last visit her BP was up, but she had missed her meds that AM.  Reports + med compliance. Denies CP/sob or swelling.   2) Hyperglycemia- last A1C 5.1.  3) Osteopenia- She continues caltrate once daily  4) Hypercalcemia- she was noted to mild hypercalcemia.  PTH normal, neg M spike.    Review of Systems    see HPI  Past Medical History  Diagnosis Date  . Arthritis   . History of chicken pox   . Hypertension   . Osteopenia 06/30/2012    History   Social History  . Marital Status: Married    Spouse Name: N/A    Number of Children: N/A  . Years of Education: N/A   Occupational History  . Not on file.   Social History Main Topics  . Smoking status: Former Smoker    Types: Cigarettes    Quit date: 01/27/2012  . Smokeless tobacco: Never Used     Comment: 8 cigarettes a day  . Alcohol Use: Yes     Comment: occasionally  . Drug Use: Yes    Special: Marijuana  . Sexual Activity: Not on file   Other Topics Concern  . 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            Past Surgical History  Procedure Laterality Date  . Cesarean section  1980  . Hand surgery  2007    carpal tunel release-left hand  . Hand surgery  2007    carpal tunel release--right hand    Family History  Problem Relation Age of Onset  . Arthritis Mother   . Cancer Mother     breast age 101  . Hypertension Mother   . Hypertension Father   . Cancer Sister 37    breast   . Hypertension Sister   . Hypertension Paternal Uncle   . Parkinson's disease Maternal Grandmother     No Known Allergies  Current Outpatient Prescriptions on File Prior to  Visit  Medication Sig Dispense Refill  . aspirin 81 MG tablet Take 81 mg by mouth daily.      . Calcium Carbonate-Vitamin D 600-400 MG-UNIT per tablet Take 1 tablet by mouth daily.      . Multiple Vitamins-Calcium (ONE-A-DAY WOMENS FORMULA PO) Take 1 tablet by mouth daily.      . promethazine (PHENERGAN) 25 MG tablet Take 25 mg by mouth as needed.       No current facility-administered medications on file prior to visit.    BP 120/66  Pulse 67  Temp(Src) 98.4 F (36.9 C) (Oral)  Resp 16  Ht 5\' 3"  (1.6 m)  Wt 127 lb (57.607 kg)  BMI 22.50 kg/m2  SpO2 99%    Objective:   Physical Exam  Constitutional: She is oriented to person, place, and time. She appears well-developed and well-nourished. No distress.  HENT:  Head: Normocephalic and atraumatic.  Cardiovascular: Normal rate and regular rhythm.   No murmur heard. Pulmonary/Chest: Effort normal and breath sounds normal. No respiratory distress. She  has no wheezes. She has no rales. She exhibits no tenderness.  Musculoskeletal: She exhibits no edema.  Neurological: She is alert and oriented to person, place, and time.  Psychiatric: She has a normal mood and affect. Her behavior is normal. Judgment and thought content normal.          Assessment & Plan:

## 2012-11-18 NOTE — Patient Instructions (Addendum)
Please complete your lab work prior to leaving. Please schedule a follow up appointment in 4 months.

## 2012-11-18 NOTE — Assessment & Plan Note (Signed)
Improved.  Continue current meds.  Obtain bmet.

## 2013-02-17 ENCOUNTER — Ambulatory Visit: Payer: BC Managed Care – PPO | Admitting: Family

## 2013-02-19 ENCOUNTER — Ambulatory Visit: Payer: BC Managed Care – PPO | Admitting: Family

## 2013-02-24 ENCOUNTER — Encounter: Payer: Self-pay | Admitting: Family

## 2013-02-24 ENCOUNTER — Ambulatory Visit (INDEPENDENT_AMBULATORY_CARE_PROVIDER_SITE_OTHER): Payer: BC Managed Care – PPO | Admitting: Family

## 2013-02-24 VITALS — BP 148/80 | HR 80 | Temp 98.1°F | Resp 16 | Ht 63.0 in | Wt 124.0 lb

## 2013-02-24 DIAGNOSIS — Z87891 Personal history of nicotine dependence: Secondary | ICD-10-CM

## 2013-02-24 DIAGNOSIS — I10 Essential (primary) hypertension: Secondary | ICD-10-CM

## 2013-02-24 MED ORDER — AMLODIPINE BESYLATE 5 MG PO TABS: 5.0000 mg | ORAL_TABLET | Freq: Every day | ORAL | Status: DC

## 2013-02-24 MED ORDER — LISINOPRIL-HYDROCHLOROTHIAZIDE 10-12.5 MG PO TABS: 1.0000 | ORAL_TABLET | Freq: Every day | ORAL | Status: DC

## 2013-02-24 NOTE — Progress Notes (Signed)
Subjective:    Patient ID: Samantha Walker, female    DOB: 1955-05-31, 58 y.o.   MRN: [id number]  HPI  Samantha Walker is a 58 yr old female who presents today for follow up of multiple medical problems.  1) HTN- denies CP/SOB. She is taking   2) tobacco abuse- she has been quit x > 1 year.      Review of Systems See HPI  Past Medical History  Diagnosis Date  . Arthritis   . History of chicken pox   . Hypertension   . Osteopenia 06/30/2012    History   Social History  . Marital Status: Married    Spouse Name: N/A    Number of Children: N/A  . Years of Education: N/A   Occupational History  . Not on file.   Social History Main Topics  . Smoking status: Former Smoker    Types: Cigarettes    Quit date: 01/27/2012  . Smokeless tobacco: Never Used     Comment: 8 cigarettes a day  . Alcohol Use: Yes     Comment: occasionally  . Drug Use: Yes    Special: Marijuana  . Sexual Activity: Not on file   Other Topics Concern  . Not on file   Social History Narrative   Rare use of marijuana   Works as Training and development officer at [employer]   She has 2 grown sons- 4 grandchildren   Married   Completed 7th grade.            Past Surgical History  Procedure Laterality Date  . Cesarean section  1980  . Hand surgery  2007    carpal tunel release-left hand  . Hand surgery  2007    carpal tunel release--right hand    Family History  Problem Relation Age of Onset  . Arthritis Mother   . Cancer Mother     breast age 42  . Hypertension Mother   . Hypertension Father   . Cancer Sister 66    breast   . Hypertension Sister   . Hypertension Paternal Uncle   . Parkinson's disease Maternal Grandmother     No Known Allergies  Current Outpatient Prescriptions on File Prior to Visit  Medication Sig Dispense Refill  . aspirin 81 MG tablet Take 81 mg by mouth daily.      . Calcium Carbonate-Vitamin D 600-400 MG-UNIT per tablet Take 1 tablet by mouth daily.      Marland Kitchen  lisinopril-hydrochlorothiazide (PRINZIDE,ZESTORETIC) 10-12.5 MG per tablet Take 1 tablet by mouth daily.  30 tablet  5  . Multiple Vitamins-Calcium (ONE-A-DAY WOMENS FORMULA PO) Take 1 tablet by mouth daily.      . promethazine (PHENERGAN) 25 MG tablet Take 25 mg by mouth as needed.       No current facility-administered medications on file prior to visit.    BP 148/80  Pulse 80  Temp(Src) 98.1 F (36.7 C) (Oral)  Resp 16  Ht 5\' 3"  (1.6 m)  Wt 124 lb (56.246 kg)  BMI 21.97 kg/m2  SpO2 99%       Objective:   Physical Exam  Constitutional: She is oriented to person, place, and time. She appears well-developed and well-nourished. No distress.  HENT:  Head: Normocephalic and atraumatic.  Cardiovascular: Normal rate and regular rhythm.   No murmur heard. Pulmonary/Chest: Effort normal and breath sounds normal. No respiratory distress. She has no wheezes. She has no rales. She exhibits no tenderness.  Musculoskeletal: She  exhibits no edema.  Neurological: She is alert and oriented to person, place, and time.  Psychiatric: She has a normal mood and affect. Her behavior is normal. Judgment and thought content normal.          Assessment & Plan:

## 2013-02-24 NOTE — Assessment & Plan Note (Signed)
BP above goal. Add amlodipine, continue lisinopril hctz, follow up in 1 month.

## 2013-02-24 NOTE — Assessment & Plan Note (Signed)
She remains quit and I commended her for this.

## 2013-02-24 NOTE — Progress Notes (Signed)
Pre visit review using our clinic review tool, if applicable. No additional management support is needed unless otherwise documented below in the visit note. 

## 2013-02-24 NOTE — Patient Instructions (Signed)
Start amlodipine once daily for your blood pressure.  Please schedule a follow up appointment in 1 month.

## 2013-02-25 ENCOUNTER — Telehealth: Payer: Self-pay | Admitting: Family

## 2013-02-25 NOTE — Telephone Encounter (Signed)
Relevant patient education mailed to patient.  

## 2013-03-05 ENCOUNTER — Other Ambulatory Visit: Payer: Self-pay | Admitting: Family

## 2013-03-05 DIAGNOSIS — Z1231 Encounter for screening mammogram for malignant neoplasm of breast: Secondary | ICD-10-CM

## 2013-03-06 ENCOUNTER — Emergency Department (HOSPITAL_COMMUNITY)
Admission: EM | Admit: 2013-03-06 | Discharge: 2013-03-07 | Disposition: A | Discharge status: Home / Self Care | Payer: BC Managed Care – PPO | Attending: Emergency Medicine | Admitting: Emergency Medicine

## 2013-03-06 DIAGNOSIS — Z7982 Long term (current) use of aspirin: Secondary | ICD-10-CM | POA: Insufficient documentation

## 2013-03-06 DIAGNOSIS — R110 Nausea: Secondary | ICD-10-CM | POA: Insufficient documentation

## 2013-03-06 DIAGNOSIS — M899 Disorder of bone, unspecified: Secondary | ICD-10-CM | POA: Insufficient documentation

## 2013-03-06 DIAGNOSIS — Z79899 Other long term (current) drug therapy: Secondary | ICD-10-CM | POA: Insufficient documentation

## 2013-03-06 DIAGNOSIS — M949 Disorder of cartilage, unspecified: Secondary | ICD-10-CM

## 2013-03-06 DIAGNOSIS — R5381 Other malaise: Secondary | ICD-10-CM | POA: Insufficient documentation

## 2013-03-06 DIAGNOSIS — R32 Unspecified urinary incontinence: Secondary | ICD-10-CM | POA: Insufficient documentation

## 2013-03-06 DIAGNOSIS — Z87891 Personal history of nicotine dependence: Secondary | ICD-10-CM | POA: Insufficient documentation

## 2013-03-06 DIAGNOSIS — E876 Hypokalemia: Secondary | ICD-10-CM | POA: Insufficient documentation

## 2013-03-06 DIAGNOSIS — M129 Arthropathy, unspecified: Secondary | ICD-10-CM | POA: Insufficient documentation

## 2013-03-06 DIAGNOSIS — Z8619 Personal history of other infectious and parasitic diseases: Secondary | ICD-10-CM | POA: Insufficient documentation

## 2013-03-06 DIAGNOSIS — I10 Essential (primary) hypertension: Secondary | ICD-10-CM | POA: Insufficient documentation

## 2013-03-06 DIAGNOSIS — R55 Syncope and collapse: Secondary | ICD-10-CM | POA: Insufficient documentation

## 2013-03-06 DIAGNOSIS — R5383 Other fatigue: Secondary | ICD-10-CM

## 2013-03-06 MED ORDER — SODIUM CHLORIDE 0.9 % IV BOLUS (SEPSIS)
1000.0000 mL | Freq: Once | INTRAVENOUS | Status: AC
  Administered 2013-03-06: 1000 mL via INTRAVENOUS

## 2013-03-06 MED ORDER — ONDANSETRON HCL 4 MG/2ML IJ SOLN
4.0000 mg | Freq: Once | INTRAMUSCULAR | Status: AC
  Administered 2013-03-06: 4 mg via INTRAVENOUS
  Filled 2013-03-06: qty 2

## 2013-03-06 NOTE — ED Provider Notes (Addendum)
CSN: [id number]     Arrival date & time 03/06/13  2237 History   First MD Initiated Contact with Patient 03/06/13 2255     Chief Complaint  Patient presents with  . Loss of Consciousness  . Nausea     (Consider location/radiation/quality/duration/timing/severity/associated sxs/prior Treatment) HPI Comments: 58 year old female with a history of hypertension taking amlodipine, lisinopril and hydrochlorothiazide. She presents after having a syncopal episode that occurred approximately one hour prior to arrival. She states that the day was rather normal, she was able to drive several hours with friends, went to bingo this evening at which time she had a hot dog from a street vendor, approximately one hour later she became "overheated" while playing bingo at which time she decided to walk outside. She became lightheaded feeling as though she was going to pass out but denied vertigo, palpitations, chest pain, headache, blurred vision, numbness or focal weakness.  She states that someone helped her to the ground, she did not hit her head, she was unconscious for a short amount of time after which time she awoke and was at her baseline. She had a urinary incontinence, no tongue biting or seizure activity that was witnessed. She was nauseated on awakening, paramedics gave her Zofran with improvement, at this time she states that she feels mildly weak and nauseated but has no other complaints. She states that she had very little to eat today before eating a hot dog. She has no history of heart abnormalities, stroke liver or kidney problems.  She denies any history of cardiac history, any history of gastrointestinal bleeding, is nonalcoholic and does not use anti-inflammatory medications very frequently  Patient is a 58 y.o. female presenting with syncope. The history is provided by the patient.  Loss of Consciousness   Past Medical History  Diagnosis Date  . Arthritis   . History of chicken pox   .  Hypertension   . Osteopenia 06/30/2012   Past Surgical History  Procedure Laterality Date  . Cesarean section  1980  . Hand surgery  2007    carpal tunel release-left hand  . Hand surgery  2007    carpal tunel release--right hand   Family History  Problem Relation Age of Onset  . Arthritis Mother   . Cancer Mother     breast age 96  . Hypertension Mother   . Hypertension Father   . Cancer Sister 32    breast   . Hypertension Sister   . Hypertension Paternal Uncle   . Parkinson's disease Maternal Grandmother    History  Substance Use Topics  . Smoking status: Former Smoker    Types: Cigarettes    Quit date: 01/27/2012  . Smokeless tobacco: Never Used     Comment: 8 cigarettes a day  . Alcohol Use: Yes     Comment: occasionally   OB History   Grav Para Term Preterm Abortions TAB SAB Ect Mult Living                 Review of Systems  Cardiovascular: Positive for syncope.  All other systems reviewed and are negative.      Allergies  Review of patient's allergies indicates no known allergies.  Home Medications   Current Outpatient Rx  Name  Route  Sig  Dispense  Refill  . acetaminophen (TYLENOL) 500 MG tablet   Oral   Take 1,000-1,500 mg by mouth every 6 (six) hours as needed (for pain).         Marland Kitchen  amLODipine (NORVASC) 5 MG tablet   Oral   Take 1 tablet (5 mg total) by mouth daily.   30 tablet   2   . aspirin 81 MG tablet   Oral   Take 81 mg by mouth daily.         . Calcium Carbonate-Vitamin D 600-400 MG-UNIT per tablet   Oral   Take 1 tablet by mouth daily.         Marland Kitchen lisinopril-hydrochlorothiazide (PRINZIDE,ZESTORETIC) 10-12.5 MG per tablet   Oral   Take 1 tablet by mouth daily.   30 tablet   5   . Multiple Vitamins-Calcium (ONE-A-DAY WOMENS FORMULA PO)   Oral   Take 1 tablet by mouth daily.         Marland Kitchen tetrahydrozoline 0.05 % ophthalmic solution   Both Eyes   Place 1 drop into both eyes as needed (for irritated eyes).           BP 123/72  Pulse 78 Physical Exam  Nursing note and vitals reviewed. Constitutional: She appears well-developed and well-nourished. No distress.  HENT:  Head: Normocephalic and atraumatic.  Mouth/Throat: Oropharynx is clear and moist. No oropharyngeal exudate.  Eyes: Conjunctivae and EOM are normal. Pupils are equal, round, and reactive to light. Right eye exhibits no discharge. Left eye exhibits no discharge. No scleral icterus.  Neck: Normal range of motion. Neck supple. No JVD present. No thyromegaly present.  Cardiovascular: Normal rate, regular rhythm, normal heart sounds and intact distal pulses.  Exam reveals no gallop and no friction rub.   No murmur heard. Pulmonary/Chest: Effort normal and breath sounds normal. No respiratory distress. She has no wheezes. She has no rales.  Abdominal: Soft. Bowel sounds are normal. She exhibits no distension and no mass. There is no tenderness.  Musculoskeletal: Normal range of motion. She exhibits no edema and no tenderness.  Lymphadenopathy:    She has no cervical adenopathy.  Neurological: She is alert. Coordination normal.  Speech is clear, movements are symmetrical, strength is 5 out of 5, coordination is normal, cranial nerves III through XII are normal, memory is intact  Skin: Skin is warm and dry. No rash noted. No erythema.  Psychiatric: She has a normal mood and affect. Her behavior is normal.    ED Course  Procedures (including critical care time) Labs Review Labs Reviewed  BASIC METABOLIC PANEL - Abnormal; Notable for the following:    Potassium 3.1 (*)    Glucose, Bld 110 (*)    All other components within normal limits   Imaging Review No results found.  EKG Interpretation    Date/Time:  Saturday March 06 2013 23:29:12 EST Ventricular Rate:  84 PR Interval:  126 QRS Duration: 79 QT Interval:  426 QTC Calculation: 504 R Axis:   69 Text Interpretation:  Sinus rhythm Borderline prolonged QT interval Abnormal ekg  Since last tracing QT prolonged Confirmed by Costa Jha  MD, Thaila Bottoms ([id number]) on 03/06/2013 11:41:10 PM            MDM   Final diagnoses:  Syncope  Hypokalemia    The patient describes a clear-cut syncopal episode, this sounds like it was vagal, she had no other symptoms to suggest an acute neurologic or cardiac event, EKG and basic metabolic panel pending, vital signs revealed no significant abnormalities. The patient appears stable with a normal neurologic and cardiac exam  Potassium slightly low, this was replaced, no orthostatic changes, stable and improved without any symptoms at this time, no  signs of cardiac dysfunction or dysrhythmia, stable for discharge, patient informed of results and agreeable to plan for recheck of potassium.  Meds given in ED:  Medications  sodium chloride 0.9 % bolus 1,000 mL (0 mLs Intravenous Stopped 03/06/13 2341)  ondansetron (ZOFRAN) injection 4 mg (4 mg Intravenous Given 03/06/13 2311)  potassium chloride SA (K-DUR,KLOR-CON) CR tablet 20 mEq (20 mEq Oral Given 03/07/13 0102)    New Prescriptions   No medications on file      Johnna Acosta, MD 03/07/13 0140  Johnna Acosta, MD 03/07/13 [phone]

## 2013-03-06 NOTE — ED Notes (Signed)
Patient arrive via EMS. Patient was playing BINGO got hot and decided to go outside passed out and when she came to patient vomited. This syncopal episode was witnessed patient did not hit her head and did have lost LOC. Upon arrival EMS states patient was Axox4 denying pain.

## 2013-03-07 LAB — BASIC METABOLIC PANEL
BUN: 15 mg/dL (ref 6–23)
CHLORIDE: 99 meq/L (ref 96–112)
CO2: 30 mEq/L (ref 19–32)
Calcium: 9.5 mg/dL (ref 8.4–10.5)
Creatinine, Ser: 0.72 mg/dL (ref 0.50–1.10)
GFR calc non Af Amer: >90 mL/min (ref >90)
Glucose, Bld: 110 mg/dL — ABNORMAL HIGH (ref 70–99)
POTASSIUM: 3.1 meq/L — AB (ref 3.7–5.3)
SODIUM: 141 meq/L (ref 137–147)

## 2013-03-07 MED ORDER — POTASSIUM CHLORIDE CRYS ER 20 MEQ PO TBCR
20.0000 meq | EXTENDED_RELEASE_TABLET | Freq: Once | ORAL | Status: AC
  Administered 2013-03-07: 20 meq via ORAL
  Filled 2013-03-07: qty 1

## 2013-03-07 MED ORDER — PROMETHAZINE HCL 25 MG RE SUPP: 25.0000 mg | Freq: Four times a day (QID) | RECTAL | Status: DC | PRN

## 2013-03-07 MED ORDER — PROMETHAZINE HCL 25 MG PO TABS: 25.0000 mg | ORAL_TABLET | Freq: Four times a day (QID) | ORAL | Status: DC | PRN

## 2013-03-07 MED ORDER — LOPERAMIDE HCL 2 MG PO CAPS: 2.0000 mg | ORAL_CAPSULE | Freq: Four times a day (QID) | ORAL | Status: DC | PRN

## 2013-03-07 MED ORDER — AMOXICILLIN-POT CLAVULANATE 875-125 MG PO TABS: 1.0000 | ORAL_TABLET | Freq: Two times a day (BID) | ORAL | Status: DC

## 2013-03-07 MED ORDER — ONDANSETRON 4 MG PO TBDP: 4.0000 mg | ORAL_TABLET | Freq: Three times a day (TID) | ORAL | Status: DC | PRN

## 2013-03-07 NOTE — Discharge Instructions (Signed)
Have your potassium rechecked in one week by your doctor, return for more fainting spells  Please call your doctor for a followup appointment within 24-48 hours. When you talk to your doctor please let them know that you were seen in the emergency department and have them acquire all of your records so that they can discuss the findings with you and formulate a treatment plan to fully care for your new and ongoing problems.

## 2013-03-08 ENCOUNTER — Telehealth: Payer: Self-pay | Admitting: Family

## 2013-03-08 NOTE — Telephone Encounter (Signed)
Please contact pt to arrange a 1 week ED follow up.

## 2013-03-08 NOTE — Telephone Encounter (Signed)
Patient scheduled appointment for 03/16/13

## 2013-03-10 ENCOUNTER — Ambulatory Visit (HOSPITAL_BASED_OUTPATIENT_CLINIC_OR_DEPARTMENT_OTHER)
Admission: RE | Admit: 2013-03-10 | Discharge: 2013-03-10 | Disposition: A | Discharge status: Home / Self Care | Payer: BC Managed Care – PPO | Source: Ambulatory Visit | Attending: Family | Admitting: Family

## 2013-03-10 DIAGNOSIS — Z1231 Encounter for screening mammogram for malignant neoplasm of breast: Secondary | ICD-10-CM | POA: Insufficient documentation

## 2013-03-16 ENCOUNTER — Ambulatory Visit: Payer: BC Managed Care – PPO | Admitting: Family

## 2013-03-24 ENCOUNTER — Ambulatory Visit: Payer: BC Managed Care – PPO | Admitting: Family

## 2013-03-24 ENCOUNTER — Ambulatory Visit (INDEPENDENT_AMBULATORY_CARE_PROVIDER_SITE_OTHER): Payer: BC Managed Care – PPO | Admitting: Family

## 2013-03-24 VITALS — BP 104/68 | HR 77 | Temp 98.6°F | Resp 16 | Ht 63.0 in | Wt 120.1 lb

## 2013-03-24 DIAGNOSIS — R55 Syncope and collapse: Secondary | ICD-10-CM

## 2013-03-24 DIAGNOSIS — E876 Hypokalemia: Secondary | ICD-10-CM

## 2013-03-24 LAB — BASIC METABOLIC PANEL
BUN: 11 mg/dL (ref 6–23)
CALCIUM: 10 mg/dL (ref 8.4–10.5)
CHLORIDE: 102 meq/L (ref 96–112)
CO2: 30 meq/L (ref 19–32)
Creat: 0.76 mg/dL (ref 0.50–1.10)
Glucose, Bld: 83 mg/dL (ref 70–99)
POTASSIUM: 3.6 meq/L (ref 3.5–5.3)
SODIUM: 140 meq/L (ref 135–145)

## 2013-03-24 NOTE — Assessment & Plan Note (Signed)
Resolved.  I suspect vasovagal response due to overtreated BP. D/C amlodipine.  Will repeat potassium level and refer to cardiology for further evaluation due to ? Of borderline prolonged QT.  Follow up in 2 weeks.

## 2013-03-24 NOTE — Progress Notes (Signed)
Pre visit review using our clinic review tool, if applicable. No additional management support is needed unless otherwise documented below in the visit note. 

## 2013-03-24 NOTE — Patient Instructions (Signed)
Please stop amlodipine. Complete lab work prior to leaving. Follow up in 2 weeks for blood pressure recheck, you will be contacted about your referral to cardiology.

## 2013-03-24 NOTE — Progress Notes (Signed)
Subjective:    Patient ID: Samantha Walker, female    DOB: Sep 16, 1955, 58 y.o.   MRN: [id number]  HPI  [name] is a 58 yr old female who presents today for ER follow up. Records reviewed.  She was seen on 03/06/13. She apparently developed a light headed feeling which was followed by a brief syncopal event without injury.  There was no witnessed seizure activity.  Potassium was 3.1 at that visit. ? Prolonged QT on EKG at that time. Her potassium was repleted.   Since returning home she still feels "light headed" at times.  She denies CP or shortness of breath. Notes ome tender varicose vein left anterior thigh. Denies shortness of breath or calf pain.   BP Readings from Last 3 Encounters:  03/24/13 104/68  03/07/13 104/67  02/24/13 148/80     Consciousness pt is here for ED f/u on saturday due to loss of consciousness. Pt said that she did not eat and drink enough on that day. Prior the fainting, she was "over- heated, heard buzzing in her ears", adn threw up all her food.       Review of Systems See HPI  Past Medical History  Diagnosis Date  . Arthritis   . History of chicken pox   . Hypertension   . Osteopenia 06/30/2012    History   Social History  . Marital Status: Married    Spouse Name: N/A    Number of Children: N/A  . Years of Education: N/A   Occupational History  . Not on file.   Social History Main Topics  . Smoking status: Former Smoker    Types: Cigarettes    Quit date: 01/27/2012  . Smokeless tobacco: Never Used     Comment: 8 cigarettes a day  . Alcohol Use: Yes     Comment: occasionally  . Drug Use: Yes    Special: Marijuana  . Sexual Activity: Not on file   Other Topics Concern  . Not on file   Social History Narrative   Rare use of marijuana   Works as Training and development officer at [employer]   She has 2 grown sons- 4 grandchildren   Married   Completed 7th grade.            Past Surgical History  Procedure Laterality Date  . Cesarean  section  1980  . Hand surgery  2007    carpal tunel release-left hand  . Hand surgery  2007    carpal tunel release--right hand    Family History  Problem Relation Age of Onset  . Arthritis Mother   . Cancer Mother     breast age 94  . Hypertension Mother   . Hypertension Father   . Cancer Sister 52    breast   . Hypertension Sister   . Hypertension Paternal Uncle   . Parkinson's disease Maternal Grandmother     No Known Allergies  Current Outpatient Prescriptions on File Prior to Visit  Medication Sig Dispense Refill  . acetaminophen (TYLENOL) 500 MG tablet Take 1,000-1,500 mg by mouth every 6 (six) hours as needed (for pain).      Marland Kitchen aspirin 81 MG tablet Take 81 mg by mouth daily.      . Calcium Carbonate-Vitamin D 600-400 MG-UNIT per tablet Take 1 tablet by mouth daily.      Marland Kitchen lisinopril-hydrochlorothiazide (PRINZIDE,ZESTORETIC) 10-12.5 MG per tablet Take 1 tablet by mouth daily.  30 tablet  5  . Multiple  Vitamins-Calcium (ONE-A-DAY WOMENS FORMULA PO) Take 1 tablet by mouth daily.      Marland Kitchen tetrahydrozoline 0.05 % ophthalmic solution Place 1 drop into both eyes as needed (for irritated eyes).       No current facility-administered medications on file prior to visit.    BP 104/68  Pulse 77  Temp(Src) 98.6 F (37 C) (Oral)  Resp 16  Ht 5\' 3"  (1.6 m)  Wt 120 lb 1.3 oz (54.468 kg)  BMI 21.28 kg/m2  SpO2 99%       Objective:   Physical Exam  Constitutional: She appears well-developed and well-nourished. No distress.  Cardiovascular: Normal rate and regular rhythm.   No murmur heard. Pulmonary/Chest: Effort normal and breath sounds normal. No respiratory distress. She has no wheezes. She has no rales. She exhibits no tenderness.  Skin: Skin is warm and dry.  + bruise noted left anterior thigh with few small spider veins beneath the bruise. No LE edema is noted.           Assessment & Plan:

## 2013-03-25 ENCOUNTER — Encounter: Payer: Self-pay | Admitting: Family

## 2013-04-07 ENCOUNTER — Ambulatory Visit: Payer: BC Managed Care – PPO | Admitting: Family

## 2013-04-19 ENCOUNTER — Ambulatory Visit (INDEPENDENT_AMBULATORY_CARE_PROVIDER_SITE_OTHER): Payer: BC Managed Care – PPO | Admitting: Family

## 2013-04-19 ENCOUNTER — Encounter: Payer: Self-pay | Admitting: Family

## 2013-04-19 VITALS — BP 118/76 | Temp 97.7°F | Ht 63.0 in | Wt 124.0 lb

## 2013-04-19 DIAGNOSIS — I10 Essential (primary) hypertension: Secondary | ICD-10-CM

## 2013-04-19 DIAGNOSIS — M542 Cervicalgia: Secondary | ICD-10-CM

## 2013-04-19 NOTE — Patient Instructions (Signed)
You may use tylenol as needed for neck pain. Call if symptoms worsen, or if symptoms do not improve. Follow up in 3 months.

## 2013-04-19 NOTE — Progress Notes (Signed)
Pre visit review using our clinic review tool, if applicable. No additional management support is needed unless otherwise documented below in the visit note. 

## 2013-04-19 NOTE — Assessment & Plan Note (Signed)
Mild musculoskeletal pain. Discussed use of heating pad prn.  Tylenol prn, sparing use of motrin.  Advised patient to call if she develops pain radiating down arm or if she develops numbness.

## 2013-04-19 NOTE — Progress Notes (Signed)
Subjective:    Patient ID: Samantha Walker, female    DOB: 05-Apr-1955, 58 y.o.   MRN: [id number]  HPI  Ms.  Tomlinson is a 58 yr old female who presents today for follow up.  HTN-  Last visit her amlodipine was discontinued due to syncopal event which was thought to be vasovagal in nature. She is scheduled to see cardiology on 4/14. Last visit, bmet revealed normal potassium. She denies recurrent syncope or dizziness.  Did have one episode of nausea.      BP Readings from Last 3 Encounters:  04/19/13 118/76  03/24/13 104/68  03/07/13 104/67   Reports soreness on the left side of neck, has a pulling sensation.  She takes tylenol which helps her pain.  Denies associated arm numbness  Or hand numbness. Hands feel "tight" ever since her carpal tunnel.   Review of Systems See HPI  Past Medical History  Diagnosis Date  . Arthritis   . History of chicken pox   . Hypertension   . Osteopenia 06/30/2012    History   Social History  . Marital Status: Married    Spouse Name: N/A    Number of Children: N/A  . Years of Education: N/A   Occupational History  . Not on file.   Social History Main Topics  . Smoking status: Former Smoker    Types: Cigarettes    Quit date: 01/27/2012  . Smokeless tobacco: Never Used     Comment: 8 cigarettes a day  . Alcohol Use: Yes     Comment: occasionally  . Drug Use: Yes    Special: Marijuana  . Sexual Activity: Not on file   Other Topics Concern  . Not on file   Social History Narrative   Rare use of marijuana   Works as Training and development officer at [employer]   She has 2 grown sons- 4 grandchildren   Married   Completed 7th grade.            Past Surgical History  Procedure Laterality Date  . Cesarean section  1980  . Hand surgery  2007    carpal tunel release-left hand  . Hand surgery  2007    carpal tunel release--right hand    Family History  Problem Relation Age of Onset  . Arthritis Mother   . Cancer Mother     breast age 11  .  Hypertension Mother   . Hypertension Father   . Cancer Sister 37    breast   . Hypertension Sister   . Hypertension Paternal Uncle   . Parkinson's disease Maternal Grandmother     No Known Allergies  Current Outpatient Prescriptions on File Prior to Visit  Medication Sig Dispense Refill  . acetaminophen (TYLENOL) 500 MG tablet Take 1,000-1,500 mg by mouth every 6 (six) hours as needed (for pain).      Marland Kitchen aspirin 81 MG tablet Take 81 mg by mouth daily.      . Calcium Carbonate-Vitamin D 600-400 MG-UNIT per tablet Take 1 tablet by mouth daily.      Marland Kitchen lisinopril-hydrochlorothiazide (PRINZIDE,ZESTORETIC) 10-12.5 MG per tablet Take 1 tablet by mouth daily.  30 tablet  5  . Multiple Vitamins-Calcium (ONE-A-DAY WOMENS FORMULA PO) Take 1 tablet by mouth daily.      Marland Kitchen tetrahydrozoline 0.05 % ophthalmic solution Place 1 drop into both eyes as needed (for irritated eyes).       No current facility-administered medications on file prior to visit.  BP 118/76  Temp(Src) 97.7 F (36.5 C) (Oral)  Ht 5\' 3"  (1.6 m)  Wt 124 lb 0.6 oz (56.264 kg)  BMI 21.98 kg/m2  SpO2 98%       Objective:   Physical Exam  Constitutional: She is oriented to person, place, and time. She appears well-developed and well-nourished. No distress.  HENT:  Head: Normocephalic and atraumatic.  Cardiovascular: Normal rate and regular rhythm.   No murmur heard. Pulmonary/Chest: Effort normal and breath sounds normal. No respiratory distress. She has no wheezes. She has no rales. She exhibits no tenderness.  Musculoskeletal:  + tenderness at base of left lateral neck. Full ROM of neck.   Neurological: She is alert and oriented to person, place, and time.  Bilateral UE strength is 5/5.  Psychiatric: She has a normal mood and affect. Her behavior is normal. Thought content normal.          Assessment & Plan:

## 2013-04-19 NOTE — Assessment & Plan Note (Signed)
BP is improved.  Continue lisinopril/hctz and remain off of amlodipine.

## 2013-04-20 ENCOUNTER — Telehealth: Payer: Self-pay | Admitting: Family

## 2013-04-20 NOTE — Telephone Encounter (Signed)
Relevant patient education mailed to patient.  

## 2013-04-27 ENCOUNTER — Ambulatory Visit: Payer: BC Managed Care – PPO | Admitting: Cardiology

## 2013-06-28 ENCOUNTER — Ambulatory Visit: Payer: BC Managed Care – PPO | Admitting: Cardiology

## 2013-06-30 ENCOUNTER — Ambulatory Visit (INDEPENDENT_AMBULATORY_CARE_PROVIDER_SITE_OTHER): Payer: BC Managed Care – PPO | Admitting: Infectious Disease

## 2013-06-30 ENCOUNTER — Encounter: Payer: Self-pay | Admitting: Infectious Disease

## 2013-06-30 DIAGNOSIS — Z9889 Other specified postprocedural states: Secondary | ICD-10-CM

## 2013-06-30 DIAGNOSIS — Z113 Encounter for screening for infections with a predominantly sexual mode of transmission: Secondary | ICD-10-CM

## 2013-06-30 DIAGNOSIS — Z209 Contact with and (suspected) exposure to unspecified communicable disease: Secondary | ICD-10-CM

## 2013-06-30 DIAGNOSIS — Z7289 Other problems related to lifestyle: Secondary | ICD-10-CM

## 2013-06-30 NOTE — Progress Notes (Signed)
   Subjective:    Patient ID: Samantha Walker, female    DOB: 22-Nov-1955, 58 y.o.   MRN: [id number]  HPI  58 year old lady who accompanied her husband to clinic today. Husband was recently dx with HIV/AIDS with low cd4 and high viral load of 300k. Pt herself had tested negative by her report 6 months ago and not had intercourse with her husband x 1 year at least but I had wanted to test her for HIV and also for syphilis since he had that in 2000 xp treatment. We did rapid oral quick which was negative and ordered HIV RNA, RPR and gc and chlamydia. Pre and post counselling testing given.    Review of Systems  Constitutional: Negative for activity change.  HENT: Negative for congestion.   Respiratory: Negative for shortness of breath.   Gastrointestinal: Negative for abdominal pain and abdominal distention.  Neurological: Negative for dizziness.  Psychiatric/Behavioral: Negative for behavioral problems and agitation.       Objective:   Physical Exam  Constitutional: She appears well-developed. No distress.  Eyes: EOM are normal.  Neck: Normal range of motion.  Cardiovascular: Normal rate and regular rhythm.   Pulmonary/Chest: No respiratory distress. She has no wheezes.  Abdominal: She exhibits no distension.  Skin: Skin is warm. She is diaphoretic. No erythema.  Psychiatric: She has a normal mood and affect. Her behavior is normal. Judgment and thought content normal.          Assessment & Plan:   High risk sexual behavior, IE being married to man with HIV and high viral load with whom she has had sex in the past: V69.8 : Rapid test negative, HIV RNA pending with rpr, gc and chlamydia  She would be candidate for truvada to prevent her acquiring infection from husband should they become active again. She was not interested in this at thistime.

## 2013-07-01 LAB — RPR

## 2013-07-03 LAB — HIV-1 RNA ULTRAQUANT REFLEX TO GENTYP+: HIV 1 RNA Quant: <20 copies/mL (ref <20)

## 2013-07-08 ENCOUNTER — Telehealth: Payer: Self-pay | Admitting: *Deleted

## 2013-07-08 ENCOUNTER — Encounter: Payer: Self-pay | Admitting: Cardiology

## 2013-07-08 ENCOUNTER — Telehealth: Payer: Self-pay | Admitting: Family

## 2013-07-08 MED ORDER — BETAMETHASONE DIPROPIONATE 0.05 % EX CREA: TOPICAL_CREAM | Freq: Two times a day (BID) | CUTANEOUS | Status: DC

## 2013-07-08 NOTE — Telephone Encounter (Signed)
Wanted to find out the results of last week's lab tests.  Shared that all tests were negative.  Pt "gratefull" for negative results.  RN encouraged the pt to be tested every 3 months due to possible future exposure.  Pt verbalized understanding.

## 2013-07-08 NOTE — Telephone Encounter (Signed)
Patient notified

## 2013-07-08 NOTE — Telephone Encounter (Signed)
Sent steroid cream, if no improvement, needs to be seen in office.

## 2013-07-08 NOTE — Telephone Encounter (Signed)
Requesting something to be called in for poison oak, [facility] on [address]

## 2013-07-11 NOTE — Telephone Encounter (Signed)
Thanks

## 2013-07-21 ENCOUNTER — Ambulatory Visit: Payer: BC Managed Care – PPO | Admitting: Family

## 2013-07-27 ENCOUNTER — Ambulatory Visit: Payer: BC Managed Care – PPO | Admitting: Family

## 2013-07-28 ENCOUNTER — Encounter: Payer: Self-pay | Admitting: Family

## 2013-07-28 ENCOUNTER — Ambulatory Visit (INDEPENDENT_AMBULATORY_CARE_PROVIDER_SITE_OTHER): Payer: BC Managed Care – PPO | Admitting: Family

## 2013-07-28 VITALS — BP 122/72 | HR 64 | Temp 98.2°F | Resp 18 | Ht 63.0 in | Wt 125.1 lb

## 2013-07-28 DIAGNOSIS — I10 Essential (primary) hypertension: Secondary | ICD-10-CM

## 2013-07-28 DIAGNOSIS — R7309 Other abnormal glucose: Secondary | ICD-10-CM

## 2013-07-28 DIAGNOSIS — R739 Hyperglycemia, unspecified: Secondary | ICD-10-CM

## 2013-07-28 DIAGNOSIS — Z87891 Personal history of nicotine dependence: Secondary | ICD-10-CM

## 2013-07-28 MED ORDER — LISINOPRIL-HYDROCHLOROTHIAZIDE 10-12.5 MG PO TABS: 1.0000 | ORAL_TABLET | Freq: Every day | ORAL | Status: DC

## 2013-07-28 NOTE — Progress Notes (Addendum)
Subjective:    Patient ID: Samantha Walker, female    DOB: 12-09-1955, 58 y.o.   MRN: [id number]  HPI  Samantha Walker is a 58 yr old female who presents today for follow up.  Since our last visit, her husband has been diagnosed with HIV/AIDS.  She saw ID and tested negative for HIV recently. She tells me that she does not plan to have sexual intercourse with her husband going forward.    Tobacco abuse- reports that she quit >1 year ago.   HTN-  Reports that she continues lisinopril/hctz.  Denies CP/SOB/swelling.   BP Readings from Last 3 Encounters:  07/28/13 122/72  04/19/13 118/76  03/24/13 104/68   Review of Systems See HPI  Past Medical History  Diagnosis Date  . Arthritis   . History of chicken pox   . Hypertension   . Osteopenia 06/30/2012    History   Social History  . Marital Status: Married    Spouse Name: N/A    Number of Children: N/A  . Years of Education: N/A   Occupational History  . Not on file.   Social History Main Topics  . Smoking status: Former Smoker    Types: Cigarettes    Quit date: 01/27/2012  . Smokeless tobacco: Never Used     Comment: 8 cigarettes a day  . Alcohol Use: Yes     Comment: occasionally  . Drug Use: Yes    Special: Marijuana  . Sexual Activity: Not on file   Other Topics Concern  . Not on file   Social History Narrative   Rare use of marijuana   Works as Training and development officer at [employer]   She has 2 grown sons- 4 grandchildren   Married   Completed 7th grade.            Past Surgical History  Procedure Laterality Date  . Cesarean section  1980  . Hand surgery  2007    carpal tunel release-left hand  . Hand surgery  2007    carpal tunel release--right hand    Family History  Problem Relation Age of Onset  . Arthritis Mother   . Cancer Mother     breast age 44  . Hypertension Mother   . Hypertension Father   . Cancer Sister 55    breast   . Hypertension Sister   . Hypertension Paternal Uncle   . Parkinson's  disease Maternal Grandmother     No Known Allergies  Current Outpatient Prescriptions on File Prior to Visit  Medication Sig Dispense Refill  . acetaminophen (TYLENOL) 500 MG tablet Take 1,000-1,500 mg by mouth every 6 (six) hours as needed (for pain).      Marland Kitchen aspirin 81 MG tablet Take 81 mg by mouth daily.      . betamethasone dipropionate (DIPROLENE) 0.05 % cream Apply topically 2 (two) times daily.  30 g  0  . Calcium Carbonate-Vitamin D 600-400 MG-UNIT per tablet Take 1 tablet by mouth daily.      . Multiple Vitamins-Calcium (ONE-A-DAY WOMENS FORMULA PO) Take 1 tablet by mouth daily.      Marland Kitchen tetrahydrozoline 0.05 % ophthalmic solution Place 1 drop into both eyes as needed (for irritated eyes).       No current facility-administered medications on file prior to visit.    BP 122/72  Pulse 64  Temp(Src) 98.2 F (36.8 C) (Oral)  Resp 18  Ht 5\' 3"  (1.6 m)  Wt 125 lb 1.9  oz (56.754 kg)  BMI 22.17 kg/m2  SpO2 97%       Objective:   Physical Exam  Constitutional: She is oriented to person, place, and time. She appears well-developed and well-nourished. No distress.  HENT:  Head: Normocephalic and atraumatic.  Cardiovascular: Normal rate and regular rhythm.   No murmur heard. Pulmonary/Chest: Effort normal and breath sounds normal. No respiratory distress. She has no wheezes. She has no rales. She exhibits no tenderness.  Musculoskeletal: She exhibits no edema.  Neurological: She is alert and oriented to person, place, and time.  Psychiatric: She has a normal mood and affect. Her behavior is normal. Judgment and thought content normal.          Assessment & Plan:  20 minutes spent with the patient.  >50% of this time was spent counseling pt in regards to stress related to husband's HIV/AIDS diagnosis.

## 2013-07-28 NOTE — Patient Instructions (Signed)
Continue lab work.  Follow up in 6 months.

## 2013-07-28 NOTE — Progress Notes (Signed)
Pre visit review using our clinic review tool, if applicable. No additional management support is needed unless otherwise documented below in the visit note. 

## 2013-07-29 NOTE — Assessment & Plan Note (Signed)
Commended pt for quitting.

## 2013-07-29 NOTE — Assessment & Plan Note (Addendum)
BP stable on current meds. Continue same. BMET is up to date.

## 2014-01-28 ENCOUNTER — Ambulatory Visit: Payer: BC Managed Care – PPO | Admitting: Family

## 2014-02-09 ENCOUNTER — Ambulatory Visit (INDEPENDENT_AMBULATORY_CARE_PROVIDER_SITE_OTHER): Payer: BLUE CROSS/BLUE SHIELD | Admitting: Family

## 2014-02-09 ENCOUNTER — Encounter: Payer: Self-pay | Admitting: Family

## 2014-02-09 VITALS — BP 141/73 | HR 84 | Temp 98.1°F | Resp 16 | Ht 63.0 in | Wt 122.0 lb

## 2014-02-09 DIAGNOSIS — I10 Essential (primary) hypertension: Secondary | ICD-10-CM

## 2014-02-09 DIAGNOSIS — R739 Hyperglycemia, unspecified: Secondary | ICD-10-CM

## 2014-02-09 LAB — HEMOGLOBIN A1C: Hgb A1c MFr Bld: 5.5 % (ref 4.6–6.5)

## 2014-02-09 LAB — BASIC METABOLIC PANEL
BUN: 11 mg/dL (ref 6–23)
CALCIUM: 10.2 mg/dL (ref 8.4–10.5)
CO2: 30 meq/L (ref 19–32)
Chloride: 103 mEq/L (ref 96–112)
Creatinine, Ser: 0.8 mg/dL (ref 0.40–1.20)
GFR: 94.84 mL/min (ref >60.00)
Glucose, Bld: 132 mg/dL — ABNORMAL HIGH (ref 70–99)
POTASSIUM: 3.5 meq/L (ref 3.5–5.1)
Sodium: 139 mEq/L (ref 135–145)

## 2014-02-09 MED ORDER — LISINOPRIL-HYDROCHLOROTHIAZIDE 10-12.5 MG PO TABS: 1.0000 | ORAL_TABLET | Freq: Every day | ORAL | Status: DC

## 2014-02-09 NOTE — Patient Instructions (Addendum)
Please complete lab work prior to leaving.  Please schedule complete physical at the front desk at your convenience.

## 2014-02-09 NOTE — Progress Notes (Signed)
Subjective:    Patient ID: Samantha Walker, female    DOB: 1955/10/05, 59 y.o.   MRN: [id number]  HPI Samantha Walker is here today for follow up for HTN and hyperglycemia. She is also here for URI sx.  1. HYPERTENSION: Reports compliance with lisinopril/hctz. The patient denies the following associated symptoms: Chest pain, dyspnea, blurred vision, headache, or lower extremity edema. BP Readings from Last 3 Encounters:  02/09/14 141/73  07/28/13 122/72  04/19/13 118/76   2. Hyperglycemia: eats small portions and does not overeat. Lab Results  Component Value Date   HGBA1C 5.4 11/18/2012   3. She started having sinus congestion, ear pressure & fullness, post nasal drainage, cough about 2 weeks ago. It has mostly resolved now except for coughing occasionally productive of some clear mucous. She reports that cough does not disturb her sleep.  Review of Systems  Respiratory: Positive for cough. Negative for shortness of breath.   Cardiovascular: Negative for chest pain.  Psychiatric/Behavioral:       Denies anxiety/depression.   Past Medical History  Diagnosis Date  . Arthritis   . History of chicken pox   . Hypertension   . Osteopenia 06/30/2012    History   Social History  . Marital Status: Married    Spouse Name: N/A    Number of Children: N/A  . Years of Education: N/A   Occupational History  . Not on file.   Social History Main Topics  . Smoking status: Former Smoker    Types: Cigarettes    Quit date: 01/27/2012  . Smokeless tobacco: Never Used     Comment: 8 cigarettes a day  . Alcohol Use: Yes     Comment: occasionally  . Drug Use: Yes    Special: Marijuana  . Sexual Activity: Not on file   Other Topics Concern  . Not on file   Social History Narrative   Rare use of marijuana   Works as Training and development officer at [employer]   She has 2 grown sons- 4 grandchildren   Married   Completed 7th grade.            Past Surgical History  Procedure Laterality Date    . Cesarean section  1980  . Hand surgery  2007    carpal tunel release-left hand  . Hand surgery  2007    carpal tunel release--right hand    Family History  Problem Relation Age of Onset  . Arthritis Mother   . Cancer Mother     breast age 31  . Hypertension Mother   . Hypertension Father   . Cancer Sister 12    breast   . Hypertension Sister   . Hypertension Paternal Uncle   . Parkinson's disease Maternal Grandmother     No Known Allergies  Current Outpatient Prescriptions on File Prior to Visit  Medication Sig Dispense Refill  . acetaminophen (TYLENOL) 500 MG tablet Take 1,000-1,500 mg by mouth every 6 (six) hours as needed (for pain).    Marland Kitchen aspirin 81 MG tablet Take 81 mg by mouth daily.    . betamethasone dipropionate (DIPROLENE) 0.05 % cream Apply topically 2 (two) times daily. 30 g 0  . Calcium Carbonate-Vitamin D 600-400 MG-UNIT per tablet Take 1 tablet by mouth daily.    . Multiple Vitamins-Calcium (ONE-A-DAY WOMENS FORMULA PO) Take 1 tablet by mouth daily.    Marland Kitchen tetrahydrozoline 0.05 % ophthalmic solution Place 1 drop into both eyes as needed (for irritated eyes).  No current facility-administered medications on file prior to visit.    BP 141/73 mmHg  Pulse 84  Temp(Src) 98.1 F (36.7 C) (Oral)  Resp 16  Ht 5\' 3"  (1.6 m)  Wt 122 lb (55.339 kg)  BMI 21.62 kg/m2  SpO2 100%       Objective:   Physical Exam  Constitutional: She is oriented to person, place, and time. She appears well-developed and well-nourished. No distress.  HENT:  Head: Normocephalic and atraumatic.  Right Ear: Tympanic membrane normal.  Left Ear: Tympanic membrane normal.  Mouth/Throat: No oropharyngeal exudate, posterior oropharyngeal edema or posterior oropharyngeal erythema.  Torus palatinus noted on palate.  Cardiovascular: Normal rate, regular rhythm and normal heart sounds.   Pulmonary/Chest: Effort normal and breath sounds normal. No respiratory distress. She has no  wheezes. She has no rales.  Lymphadenopathy:    She has no cervical adenopathy.  Neurological: She is alert and oriented to person, place, and time.  Skin: Skin is warm and dry. She is not diaphoretic.  Psychiatric: She has a normal mood and affect. Her behavior is normal. Judgment and thought content normal.          Assessment & Plan:  Patient seen along with [facility] NP-student.  I have personally seen and examined patient and agree with Samantha Walker's assessment and plan- Lab Results  Component Value Date   HGBA1C 5.5 02/09/2014   Sugar mildly elevated- advised avoiding concentrated sweets. See phone note 1/28.  Samantha Alar NP

## 2014-02-09 NOTE — Assessment & Plan Note (Signed)
Stable on lisinopril/hctz. Check BMET today.

## 2014-02-09 NOTE — Assessment & Plan Note (Signed)
Check A1c today.

## 2014-02-09 NOTE — Progress Notes (Signed)
Pre visit review using our clinic review tool, if applicable. No additional management support is needed unless otherwise documented below in the visit note/SLS  

## 2014-02-10 ENCOUNTER — Encounter: Payer: Self-pay | Admitting: Family

## 2014-03-17 IMAGING — CR DG CHEST 2V
2 series · 2 of 2 positions shown · non-contrast
Comparison: None.

CLINICAL DATA: Fever.  Diarrhea.  Chest pain and short of breath

CHEST - 2 VIEW

[w chest pa]
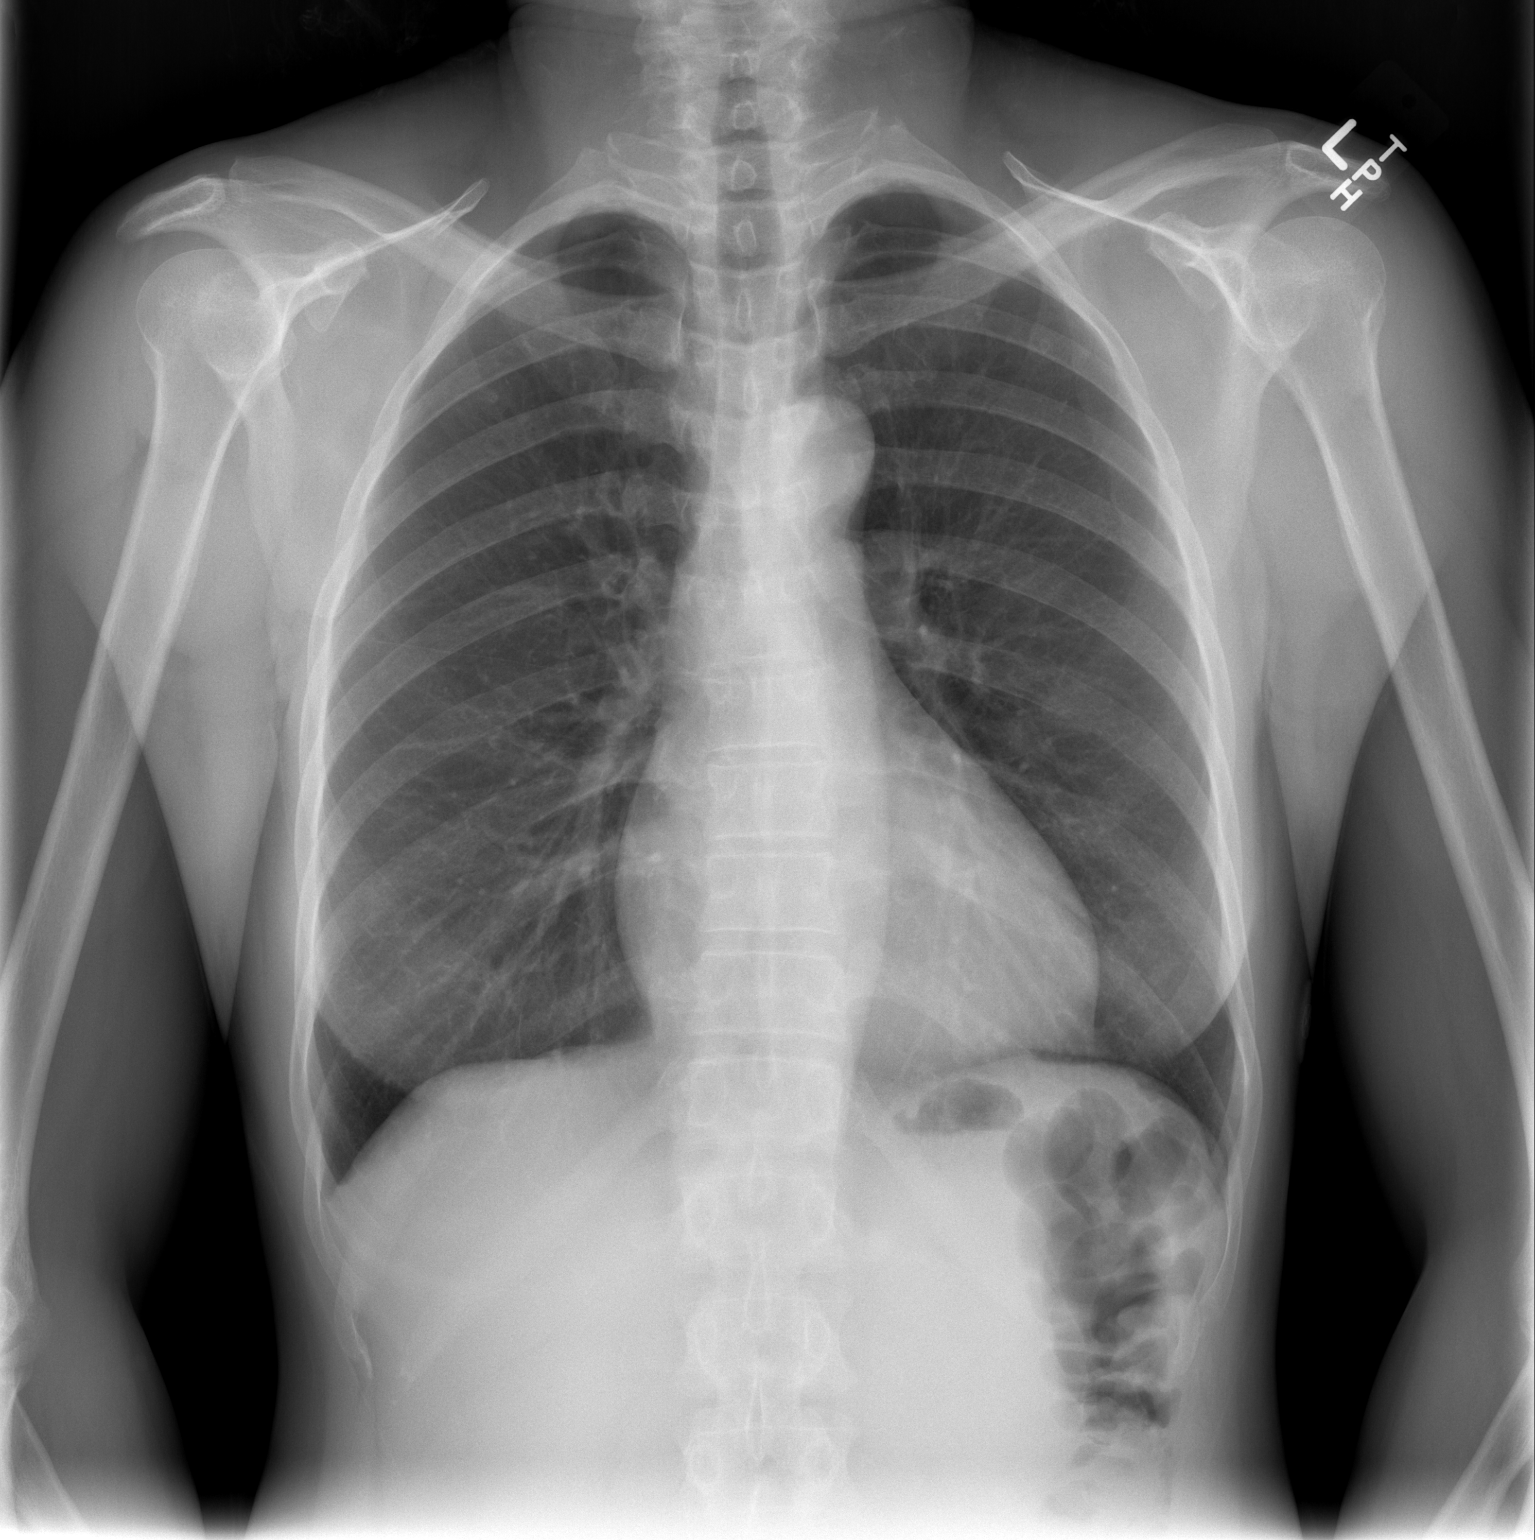

[w chest lat]
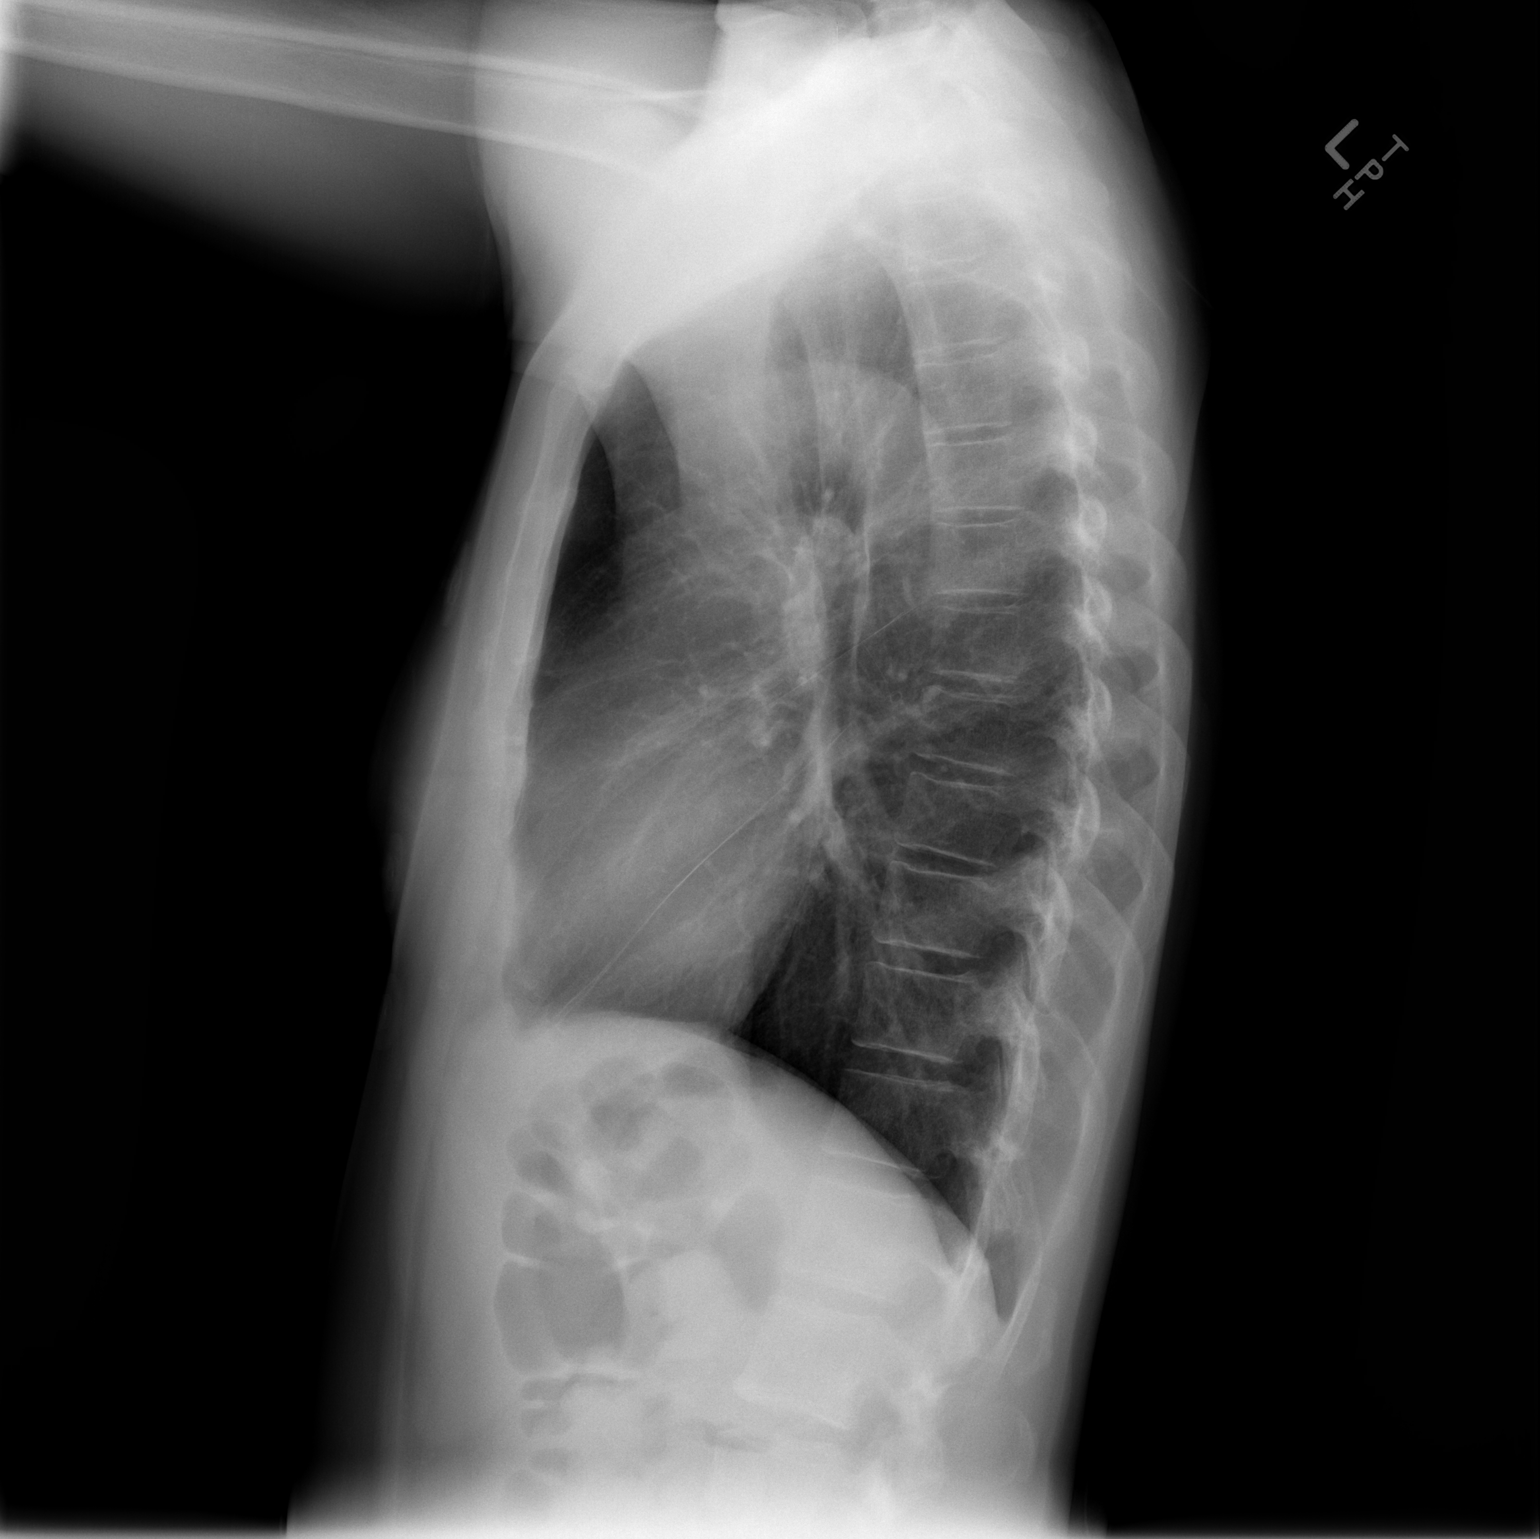

[2 of 2 positions shown; findings below may reference images not displayed]

FINDINGS: The heart size and mediastinal contours are within
normal limits.  Both lungs are clear.  The visualized skeletal
structures are unremarkable.
IMPRESSION: No active cardiopulmonary disease.

## 2014-04-06 ENCOUNTER — Other Ambulatory Visit: Payer: Self-pay

## 2014-04-06 DIAGNOSIS — Z1239 Encounter for other screening for malignant neoplasm of breast: Secondary | ICD-10-CM

## 2014-04-13 ENCOUNTER — Telehealth: Payer: Self-pay | Admitting: Family

## 2014-04-13 NOTE — Telephone Encounter (Signed)
pre visit letter sent °

## 2014-04-15 ENCOUNTER — Ambulatory Visit: Payer: BLUE CROSS/BLUE SHIELD

## 2014-04-15 ENCOUNTER — Ambulatory Visit (HOSPITAL_BASED_OUTPATIENT_CLINIC_OR_DEPARTMENT_OTHER)
Admission: RE | Admit: 2014-04-15 | Discharge: 2014-04-15 | Disposition: A | Discharge status: Home / Self Care | Payer: BLUE CROSS/BLUE SHIELD | Source: Ambulatory Visit | Attending: Family | Admitting: Family

## 2014-04-15 DIAGNOSIS — Z1231 Encounter for screening mammogram for malignant neoplasm of breast: Secondary | ICD-10-CM | POA: Insufficient documentation

## 2014-04-15 DIAGNOSIS — Z1239 Encounter for other screening for malignant neoplasm of breast: Secondary | ICD-10-CM

## 2014-05-03 ENCOUNTER — Telehealth: Payer: Self-pay | Admitting: *Deleted

## 2014-05-03 NOTE — Telephone Encounter (Signed)
Unable to reach pt. Will try again

## 2014-05-03 NOTE — Telephone Encounter (Signed)
Unable to reach left message on VM

## 2014-05-04 ENCOUNTER — Encounter: Payer: Self-pay | Admitting: Internal Medicine

## 2014-05-04 ENCOUNTER — Encounter: Payer: Self-pay | Admitting: Family

## 2014-05-04 ENCOUNTER — Ambulatory Visit (INDEPENDENT_AMBULATORY_CARE_PROVIDER_SITE_OTHER): Payer: BLUE CROSS/BLUE SHIELD | Admitting: Family

## 2014-05-04 VITALS — BP 130/70 | HR 63 | Temp 97.7°F | Resp 16 | Ht 62.25 in | Wt 124.2 lb

## 2014-05-04 DIAGNOSIS — Z0000 Encounter for general adult medical examination without abnormal findings: Secondary | ICD-10-CM

## 2014-05-04 DIAGNOSIS — R739 Hyperglycemia, unspecified: Secondary | ICD-10-CM

## 2014-05-04 DIAGNOSIS — I10 Essential (primary) hypertension: Secondary | ICD-10-CM | POA: Diagnosis not present

## 2014-05-04 LAB — HEPATIC FUNCTION PANEL
ALT: 11 U/L (ref 0–35)
AST: 22 U/L (ref 0–37)
Albumin: 4.5 g/dL (ref 3.5–5.2)
Alkaline Phosphatase: 62 U/L (ref 39–117)
Bilirubin, Direct: 0.1 mg/dL (ref 0.0–0.3)
Total Bilirubin: 0.5 mg/dL (ref 0.2–1.2)
Total Protein: 7.3 g/dL (ref 6.0–8.3)

## 2014-05-04 LAB — BASIC METABOLIC PANEL
BUN: 14 mg/dL (ref 6–23)
CHLORIDE: 104 meq/L (ref 96–112)
CO2: 30 mEq/L (ref 19–32)
CREATININE: 0.72 mg/dL (ref 0.40–1.20)
Calcium: 10 mg/dL (ref 8.4–10.5)
GFR: 107.01 mL/min (ref >60.00)
Glucose, Bld: 89 mg/dL (ref 70–99)
Potassium: 3.6 mEq/L (ref 3.5–5.1)
Sodium: 139 mEq/L (ref 135–145)

## 2014-05-04 LAB — CBC WITH DIFFERENTIAL/PLATELET
BASOS PCT: 0.8 % (ref 0.0–3.0)
Basophils Absolute: 0 10*3/uL (ref 0.0–0.1)
EOS PCT: 3.2 % (ref 0.0–5.0)
Eosinophils Absolute: 0.1 10*3/uL (ref 0.0–0.7)
HEMATOCRIT: 39.3 % (ref 36.0–46.0)
Hemoglobin: 13 g/dL (ref 12.0–15.0)
Lymphocytes Relative: 43.9 % (ref 12.0–46.0)
Lymphs Abs: 1.9 10*3/uL (ref 0.7–4.0)
MCHC: 33 g/dL (ref 30.0–36.0)
MCV: 95.6 fl (ref 78.0–100.0)
MONO ABS: 0.4 10*3/uL (ref 0.1–1.0)
MONOS PCT: 10.1 % (ref 3.0–12.0)
NEUTROS ABS: 1.8 10*3/uL (ref 1.4–7.7)
Neutrophils Relative %: 42 % — ABNORMAL LOW (ref 43.0–77.0)
Platelets: 263 10*3/uL (ref 150.0–400.0)
RBC: 4.11 Mil/uL (ref 3.87–5.11)
RDW: 14.8 % (ref 11.5–15.5)
WBC: 4.4 10*3/uL (ref 4.0–10.5)

## 2014-05-04 LAB — LIPID PANEL
CHOLESTEROL: 193 mg/dL (ref 0–200)
HDL: 67.9 mg/dL (ref >39.00)
LDL Cholesterol: 113 mg/dL — ABNORMAL HIGH (ref 0–99)
NonHDL: 125.1
TRIGLYCERIDES: 61 mg/dL (ref 0.0–149.0)
Total CHOL/HDL Ratio: 3
VLDL: 12.2 mg/dL (ref 0.0–40.0)

## 2014-05-04 LAB — URINALYSIS, ROUTINE W REFLEX MICROSCOPIC
Bilirubin Urine: NEGATIVE
Hgb urine dipstick: NEGATIVE
Ketones, ur: NEGATIVE
LEUKOCYTES UA: NEGATIVE
NITRITE: NEGATIVE
PH: 7 (ref 5.0–8.0)
Specific Gravity, Urine: 1.01 (ref 1.000–1.030)
Total Protein, Urine: NEGATIVE
Urine Glucose: NEGATIVE
Urobilinogen, UA: 0.2 (ref 0.0–1.0)
WBC, UA: NONE SEEN (ref 0–?)

## 2014-05-04 LAB — TSH: TSH: 0.45 u[IU]/mL (ref 0.35–4.50)

## 2014-05-04 MED ORDER — LISINOPRIL-HYDROCHLOROTHIAZIDE 10-12.5 MG PO TABS: 1.0000 | ORAL_TABLET | Freq: Every day | ORAL | Status: DC

## 2014-05-04 NOTE — Patient Instructions (Addendum)
Please complete lab work prior to leaving.  Follow up in 6 months.  

## 2014-05-04 NOTE — Progress Notes (Signed)
Pre visit review using our clinic review tool, if applicable. No additional management support is needed unless otherwise documented below in the visit note. 

## 2014-05-04 NOTE — Assessment & Plan Note (Addendum)
Immunizations reviewed and up to date.  Continue healthy diet, exercise.  Obtain routine labs. Refer for colonoscopy. Pt advised to set up vision and dental exams.

## 2014-05-04 NOTE — Progress Notes (Signed)
Subjective:    Patient ID: Samantha Walker, female    DOB: 17-Jul-1955, 59 y.o.   MRN: [id number]  HPI  Ms. Mangini is a 59 year old female who presents today for cpx.  Immunizations:  Up to date Diet:  Reports healthy diet Exercise:  Very active in her garden, walks Colonoscopy:  due Dexa: 3/14- plan to repeat next year Pap Smear:  Up to date Mammogram: up to date Eye exam-  5 years ago Dental- due    Review of Systems  Constitutional: Negative for unexpected weight change.  HENT: Negative for hearing loss and rhinorrhea.   Eyes: Negative for visual disturbance.  Respiratory: Negative for cough and shortness of breath.   Cardiovascular: Negative for chest pain and leg swelling.  Gastrointestinal: Negative for nausea and constipation.       Gas/diarrhea if she eats dairy  Genitourinary: Negative for dysuria and frequency.  Musculoskeletal: Negative for myalgias and arthralgias.  Skin: Negative for rash.  Neurological:       Rare headaches  Hematological: Negative for adenopathy.  Psychiatric/Behavioral: Negative for dysphoric mood and agitation.       Past Medical History  Diagnosis Date  . Arthritis   . History of chicken pox   . Hypertension   . Osteopenia 06/30/2012    History   Social History  . Marital Status: Married    Spouse Name: N/A  . Number of Children: N/A  . Years of Education: N/A   Occupational History  . Not on file.   Social History Main Topics  . Smoking status: Former Smoker    Types: Cigarettes    Quit date: 01/27/2012  . Smokeless tobacco: Never Used     Comment: 8 cigarettes a day  . Alcohol Use: Yes     Comment: occasionally  . Drug Use: Yes    Special: Marijuana  . Sexual Activity: Not on file   Other Topics Concern  . Not on file   Social History Narrative   Rare use of marijuana   Works as Training and development officer at [employer]   She has 2 grown sons- 4 grandchildren   Married   Completed 7th grade.            Past Surgical  History  Procedure Laterality Date  . Cesarean section  1980  . Hand surgery  2007    carpal tunel release-left hand  . Hand surgery  2007    carpal tunel release--right hand    Family History  Problem Relation Age of Onset  . Arthritis Mother   . Cancer Mother     breast age 25  . Hypertension Mother   . Seizures Mother   . Hypertension Father   . Cancer Sister 43    breast   . Hypertension Sister   . Hypertension Paternal Uncle   . Parkinson's disease Maternal Grandmother     No Known Allergies  Current Outpatient Prescriptions on File Prior to Visit  Medication Sig Dispense Refill  . acetaminophen (TYLENOL) 500 MG tablet Take 1,000-1,500 mg by mouth every 6 (six) hours as needed (for pain).    Marland Kitchen aspirin 81 MG tablet Take 81 mg by mouth daily.    . betamethasone dipropionate (DIPROLENE) 0.05 % cream Apply topically 2 (two) times daily. 30 g 0  . Calcium Carbonate-Vitamin D 600-400 MG-UNIT per tablet Take 1 tablet by mouth daily.    . Multiple Vitamins-Calcium (ONE-A-DAY WOMENS FORMULA PO) Take 1 tablet by mouth  daily.    . tetrahydrozoline 0.05 % ophthalmic solution Place 1 drop into both eyes as needed (for irritated eyes).     No current facility-administered medications on file prior to visit.    BP 130/70 mmHg  Pulse 63  Temp(Src) 97.7 F (36.5 C) (Oral)  Resp 16  Ht 5' 2.25" (1.581 m)  Wt 124 lb 3.2 oz (56.337 kg)  BMI 22.54 kg/m2  SpO2 99%    Objective:   Physical Exam Physical Exam  Constitutional: She is oriented to person, place, and time. She appears well-developed and well-nourished. No distress.  HENT:  Head: Normocephalic and atraumatic.  Right Ear: Tympanic membrane and ear canal normal.  Left Ear: Tympanic membrane and ear canal normal.  Mouth/Throat: Oropharynx is clear and moist.  Eyes: Pupils are equal, round, and reactive to light. No scleral icterus.  Neck: Normal range of motion. No thyromegaly present.  Cardiovascular: Normal rate  and regular rhythm.   No murmur heard. Pulmonary/Chest: Effort normal and breath sounds normal. No respiratory distress. He has no wheezes. She has no rales. She exhibits no tenderness.  Abdominal: Soft. Bowel sounds are normal. He exhibits no distension and no mass. There is no tenderness. There is no rebound and no guarding.  Musculoskeletal: She exhibits no edema.  Lymphadenopathy:    She has no cervical adenopathy.  Neurological: She is alert and oriented to person, place, and time. She has normal patellar reflexes. She exhibits normal muscle tone. Coordination normal.  Skin: Skin is warm and dry.  Psychiatric: She has a normal mood and affect. Her behavior is normal. Judgment and thought content normal.  Breasts: Examined lying Right: Without masses, retractions, discharge or axillary adenopathy.  Left: Without masses, retractions, discharge or axillary adenopathy.          Assessment & Plan:          Assessment & Plan:

## 2014-05-09 ENCOUNTER — Telehealth: Payer: Self-pay | Admitting: Family

## 2014-05-09 NOTE — Telephone Encounter (Signed)
Spoke with [employer] [facility] and was told they did not receive a specimen with the order. Did specimen get drawn?

## 2014-05-09 NOTE — Telephone Encounter (Signed)
Please contact [facility] and check status of HIV screen sent 4/20.

## 2014-05-10 ENCOUNTER — Other Ambulatory Visit: Payer: BLUE CROSS/BLUE SHIELD

## 2014-05-11 ENCOUNTER — Encounter: Payer: Self-pay | Admitting: Family

## 2014-05-11 LAB — HIV ANTIBODY (ROUTINE TESTING W REFLEX): HIV: NONREACTIVE

## 2014-05-11 NOTE — Telephone Encounter (Signed)
Result is now available.  See lab letter.

## 2014-06-01 ENCOUNTER — Ambulatory Visit (AMBULATORY_SURGERY_CENTER): Payer: Self-pay | Admitting: *Deleted

## 2014-06-01 VITALS — Ht 62.0 in | Wt 122.0 lb

## 2014-06-01 DIAGNOSIS — Z1211 Encounter for screening for malignant neoplasm of colon: Secondary | ICD-10-CM

## 2014-06-01 NOTE — Progress Notes (Signed)
No egg or soy allergy Dairy makes her have increased gas and abdominal cramps No diet pills No home 02 No issues with past sedation emmi video declined

## 2014-06-16 ENCOUNTER — Encounter: Payer: Self-pay | Admitting: Internal Medicine

## 2014-06-17 ENCOUNTER — Encounter: Payer: Self-pay | Admitting: Internal Medicine

## 2014-06-17 ENCOUNTER — Ambulatory Visit (AMBULATORY_SURGERY_CENTER): Payer: BLUE CROSS/BLUE SHIELD | Admitting: Internal Medicine

## 2014-06-17 VITALS — BP 125/78 | HR 61 | Temp 96.7°F | Resp 17 | Ht 62.0 in | Wt 122.0 lb

## 2014-06-17 DIAGNOSIS — D125 Benign neoplasm of sigmoid colon: Secondary | ICD-10-CM | POA: Diagnosis not present

## 2014-06-17 DIAGNOSIS — K635 Polyp of colon: Secondary | ICD-10-CM

## 2014-06-17 DIAGNOSIS — D124 Benign neoplasm of descending colon: Secondary | ICD-10-CM | POA: Diagnosis not present

## 2014-06-17 DIAGNOSIS — Z1211 Encounter for screening for malignant neoplasm of colon: Secondary | ICD-10-CM

## 2014-06-17 MED ORDER — SODIUM CHLORIDE 0.9 % IV SOLN: 500.0000 mL | INTRAVENOUS | Status: DC

## 2014-06-17 NOTE — Op Note (Signed)
[place]  [address]. [place], [id number]   COLONOSCOPY PROCEDURE REPORT  PATIENT: Samantha Walker, Samantha Walker  MR#: [id number] BIRTHDATE: 1956-01-01 , 55  yrs. old GENDER: female ENDOSCOPIST: Gatha Mayer, MD, [facility] PROCEDURE DATE:  06/17/2014 PROCEDURE:   Colonoscopy, screening and Colonoscopy with snare polypectomy First Screening Colonoscopy - Avg.  risk and is 50 yrs.  old or older Yes.  Prior Negative Screening - Now for repeat screening. N/A  History of Adenoma - Now for follow-up colonoscopy & has been > or = to 3 yrs.  N/A  Polyps removed today? Yes ASA CLASS:   Class II INDICATIONS:Screening for colonic neoplasia and Colorectal Neoplasm Risk Assessment for this procedure is average risk. MEDICATIONS: Propofol 200 mg IV and Monitored anesthesia care  DESCRIPTION OF PROCEDURE:   After the risks benefits and alternatives of the procedure were thoroughly explained, informed consent was obtained.  The digital rectal exam revealed no abnormalities of the rectum.   The LB [id number]  endoscope was introduced through the anus and advanced to the cecum, which was identified by both the appendix and ileocecal valve. No adverse events experienced.   The quality of the prep was (MiraLax was used) good.  The instrument was then slowly withdrawn as the colon was fully examined. Estimated blood loss is zero unless otherwise noted in this procedure report.  COLON FINDINGS: Two sessile polyps ranging from 3 to 53mm in size were found in the descending colon and sigmoid colon. Polypectomies were performed with a cold snare.  The resection was complete, the polyp tissue was completely retrieved and sent to histology.   The examination was otherwise normal.  Retroflexed views revealed no abnormalities. The time to cecum = 2.5 Withdrawal time = 12.6   The scope was withdrawn and the procedure completed. COMPLICATIONS: There were no immediate complications.  ENDOSCOPIC  IMPRESSION: 1.   Two sessile polyps ranging from 3 to 69mm in size were found in the descending colon and sigmoid colon; polypectomies were performed with a cold snare 2.   The examination was otherwise normal  RECOMMENDATIONS: Timing of repeat colonoscopy will be determined by pathology findings.  eSigned:  Gatha Mayer, MD, [facility] 06/17/2014 2:23 PM   cc: Debbrah Alar, NP and The Patient

## 2014-06-17 NOTE — Progress Notes (Signed)
To recovery, report to [place], Therapist, sports. VSS

## 2014-06-17 NOTE — Progress Notes (Signed)
Called to room to assist during endoscopic procedure.  Patient ID and intended procedure confirmed with present staff. Received instructions for my participation in the procedure from the performing physician.  

## 2014-06-17 NOTE — Patient Instructions (Addendum)
I found and removed 2 small polyps that look benign.  I will let you know pathology results and when to have another routine colonoscopy by mail.  I appreciate the opportunity to care for you. Gatha Mayer, MD, FACG    YOU HAD AN ENDOSCOPIC PROCEDURE TODAY AT [facility]:   Refer to the procedure report that was given to you for any specific questions about what was found during the examination.  If the procedure report does not answer your questions, please call your gastroenterologist to clarify.  If you requested that your care partner not be given the details of your procedure findings, then the procedure report has been included in a sealed envelope for you to review at your convenience later.  YOU SHOULD EXPECT: Some feelings of bloating in the abdomen. Passage of more gas than usual.  Walking can help get rid of the air that was put into your GI tract during the procedure and reduce the bloating. If you had a lower endoscopy (such as a colonoscopy or flexible sigmoidoscopy) you may notice spotting of blood in your stool or on the toilet paper. If you underwent a bowel prep for your procedure, you may not have a normal bowel movement for a few days.  Please Note:  You might notice some irritation and congestion in your nose or some drainage.  This is from the oxygen used during your procedure.  There is no need for concern and it should clear up in a day or so.  SYMPTOMS TO REPORT IMMEDIATELY:   Following lower endoscopy (colonoscopy or flexible sigmoidoscopy):  Excessive amounts of blood in the stool  Significant tenderness or worsening of abdominal pains  Swelling of the abdomen that is new, acute  Fever of 100F or higher   For urgent or emergent issues, a gastroenterologist can be reached at any hour by calling [phone].   DIET: Your first meal following the procedure should be a small meal and then it is ok to progress to your normal diet.  Heavy or fried foods are harder to digest and may make you feel nauseous or bloated.  Likewise, meals heavy in dairy and vegetables can increase bloating.  Drink plenty of fluids but you should avoid alcoholic beverages for 24 hours.  ACTIVITY:  You should plan to take it easy for the rest of today and you should NOT DRIVE or use heavy machinery until tomorrow (because of the sedation medicines used during the test).    FOLLOW UP: Our staff will call the number listed on your records the next business day following your procedure to check on you and address any questions or concerns that you may have regarding the information given to you following your procedure. If we do not reach you, we will leave a message.  However, if you are feeling well and you are not experiencing any problems, there is no need to return our call.  We will assume that you have returned to your regular daily activities without incident.  If any biopsies were taken you will be contacted by phone or by letter within the next 1-3 weeks.  Please call us at [phone] if you have not heard about the biopsies in 3 weeks.    SIGNATURES/CONFIDENTIALITY: You and/or your care partner have signed paperwork which will be entered into your electronic medical record.  These signatures attest to the fact that that the information above on your After Visit Summary has been  reviewed and is understood.  Full responsibility of the confidentiality of this discharge information lies with you and/or your care-partner.    Handout was given to your care partner on polyps. You may resume your current medications today. Await biopsy results. Please call if any questions or concerns.

## 2014-06-17 NOTE — Progress Notes (Signed)
No problems noted in the recovery room. maw 

## 2014-06-20 ENCOUNTER — Telehealth: Payer: Self-pay | Admitting: *Deleted

## 2014-06-20 NOTE — Telephone Encounter (Signed)
  Follow up Call-  Call back number 06/17/2014  Post procedure Call Back phone  # [phone]  Permission to leave phone message Yes     Patient questions:  Do you have a fever, pain , or abdominal swelling? No. Pain Score  0 *  Have you tolerated food without any problems? Yes.    Have you been able to return to your normal activities? Yes.    Do you have any questions about your discharge instructions: Diet   No. Medications  No. Follow up visit  No.  Do you have questions or concerns about your Care? No.  Actions: * If pain score is 4 or above: No action needed, pain <4.

## 2014-06-25 ENCOUNTER — Encounter: Payer: Self-pay | Admitting: Internal Medicine

## 2014-06-25 DIAGNOSIS — Z860101 Personal history of adenomatous and serrated colon polyps: Secondary | ICD-10-CM | POA: Insufficient documentation

## 2014-06-25 DIAGNOSIS — Z86010 Personal history of colonic polyps: Secondary | ICD-10-CM

## 2014-06-25 HISTORY — DX: Personal history of adenomatous and serrated colon polyps: Z86.0101

## 2014-06-25 HISTORY — DX: Personal history of colonic polyps: Z86.010

## 2014-06-25 NOTE — Progress Notes (Signed)
Quick Note:  Diminutive adenoma + mucosal polyp Repeat colonoscopy 5 years 2021 ______

## 2014-11-09 ENCOUNTER — Ambulatory Visit: Payer: BLUE CROSS/BLUE SHIELD | Admitting: Family

## 2014-11-16 ENCOUNTER — Encounter: Payer: Self-pay | Admitting: Family

## 2014-11-16 ENCOUNTER — Ambulatory Visit (INDEPENDENT_AMBULATORY_CARE_PROVIDER_SITE_OTHER): Payer: BLUE CROSS/BLUE SHIELD | Admitting: Family

## 2014-11-16 VITALS — BP 126/67 | HR 85 | Temp 98.3°F | Resp 16 | Ht 62.25 in | Wt 122.0 lb

## 2014-11-16 DIAGNOSIS — M25512 Pain in left shoulder: Secondary | ICD-10-CM

## 2014-11-16 DIAGNOSIS — I10 Essential (primary) hypertension: Secondary | ICD-10-CM | POA: Diagnosis not present

## 2014-11-16 DIAGNOSIS — M25511 Pain in right shoulder: Secondary | ICD-10-CM

## 2014-11-16 LAB — BASIC METABOLIC PANEL
BUN: 9 mg/dL (ref 6–23)
CO2: 31 mEq/L (ref 19–32)
Calcium: 10.3 mg/dL (ref 8.4–10.5)
Chloride: 104 mEq/L (ref 96–112)
Creatinine, Ser: 0.72 mg/dL (ref 0.40–1.20)
GFR: 106.81 mL/min (ref >60.00)
GLUCOSE: 93 mg/dL (ref 70–99)
POTASSIUM: 3.6 meq/L (ref 3.5–5.1)
Sodium: 142 mEq/L (ref 135–145)

## 2014-11-16 MED ORDER — MELOXICAM 7.5 MG PO TABS: 7.5000 mg | ORAL_TABLET | Freq: Every day | ORAL | Status: DC

## 2014-11-16 MED ORDER — LISINOPRIL-HYDROCHLOROTHIAZIDE 10-12.5 MG PO TABS: 1.0000 | ORAL_TABLET | Freq: Every day | ORAL | Status: DC

## 2014-11-16 NOTE — Assessment & Plan Note (Signed)
BP stable on current meds. Continue same. Obtain bmet 

## 2014-11-16 NOTE — Progress Notes (Signed)
Pre visit review using our clinic review tool, if applicable. No additional management support is needed unless otherwise documented below in the visit note. 

## 2014-11-16 NOTE — Progress Notes (Signed)
Subjective:    Patient ID: Samantha Walker, female    DOB: 28-Jul-1955, 59 y.o.   MRN: [id number]  HPI  [name] is a 59 yr old female who presents today for follow up.  HTN- Pt is maintained on lisinopril-hctz. BP Readings from Last 3 Encounters:  11/16/14 126/67  06/17/14 125/78  05/04/14 130/70   Bilateral shoulder pain R>L.  Began 2 weeks ago. Describes pain as aching.  Review of Systems  Respiratory: Negative for shortness of breath.   Cardiovascular: Negative for chest pain.   Past Medical History  Diagnosis Date  . Arthritis   . History of chicken pox   . Hypertension   . Osteopenia 06/30/2012  . Allergy   . Hx of adenomatous polyp of colon 06/25/2014    Social History   Social History  . Marital Status: Married    Spouse Name: N/A  . Number of Children: N/A  . Years of Education: N/A   Occupational History  . Not on file.   Social History Main Topics  . Smoking status: Current Some Day Smoker    Types: Cigarettes    Last Attempt to Quit: 01/27/2012  . Smokeless tobacco: Never Used     Comment: 0-2 cigarettes a day  . Alcohol Use: 0.0 oz/week    0 Standard drinks or equivalent per week     Comment: occasionally  . Drug Use: Yes    Special: Marijuana  . Sexual Activity: Not on file   Other Topics Concern  . Not on file   Social History Narrative   Rare use of marijuana   Works as Training and development officer at [employer]   She has 2 grown sons- 4 grandchildren   Married   Completed 7th grade.            Past Surgical History  Procedure Laterality Date  . Cesarean section  1980  . Hand surgery  2007    carpal tunel release-left hand  . Hand surgery  2007    carpal tunel release--right hand    Family History  Problem Relation Age of Onset  . Arthritis Mother   . Cancer Mother     breast age 11  . Hypertension Mother   . Seizures Mother   . Breast cancer Mother   . Hypertension Father   . Cancer Sister 48    breast   . Hypertension Sister     . Breast cancer Sister   . Hypertension Paternal Uncle   . Parkinson's disease Maternal Grandmother   . Colon cancer Neg Hx   . Rectal cancer Neg Hx   . Stomach cancer Neg Hx   . Esophageal cancer Neg Hx     No Known Allergies  Current Outpatient Prescriptions on File Prior to Visit  Medication Sig Dispense Refill  . acetaminophen (TYLENOL) 500 MG tablet Take 1,000-1,500 mg by mouth every 6 (six) hours as needed (for pain).    Marland Kitchen aspirin 81 MG tablet Take 81 mg by mouth daily.    . Calcium Carbonate-Vitamin D 600-400 MG-UNIT per tablet Take 1 tablet by mouth daily.    . Multiple Vitamins-Calcium (ONE-A-DAY WOMENS FORMULA PO) Take 1 tablet by mouth daily.     No current facility-administered medications on file prior to visit.    BP 126/67 mmHg  Pulse 85  Temp(Src) 98.3 F (36.8 C) (Oral)  Resp 16  Ht 5' 2.25" (1.581 m)  Wt 122 lb (55.339 kg)  BMI 22.14  kg/m2  SpO2 100%        Objective:   Physical Exam  Constitutional: She is oriented to person, place, and time. She appears well-developed and well-nourished.  HENT:  Head: Normocephalic and atraumatic.  Cardiovascular: Normal rate, regular rhythm and normal heart sounds.   No murmur heard. Pulmonary/Chest: Effort normal and breath sounds normal. No respiratory distress. She has no wheezes.  Musculoskeletal: She exhibits no edema.  Bilateral shoulders- non-tender to palpation, mild pain with ROM, neg empty can bilaterally  Neurological: She is alert and oriented to person, place, and time.  Skin: Skin is warm and dry.  Psychiatric: She has a normal mood and affect. Her behavior is normal. Judgment and thought content normal.          Assessment & Plan:  Bilateral shoulder pain- Likely OA, trial of meloxicam.  If symptoms worsen or do not improve with NSAIDS plan referral to sport medicine.  Pt wishes to get flu shot at [facility]

## 2014-11-16 NOTE — Patient Instructions (Signed)
Please complete lab work prior to leaving. Start meloxicam for shoulder pain- call if pain worsens or does not improve with meloxicam.

## 2014-11-18 ENCOUNTER — Encounter: Payer: Self-pay | Admitting: Family

## 2015-02-28 ENCOUNTER — Telehealth: Payer: Self-pay | Admitting: Internal Medicine

## 2015-02-28 ENCOUNTER — Other Ambulatory Visit: Payer: Self-pay | Admitting: Internal Medicine

## 2015-02-28 MED ORDER — OSELTAMIVIR PHOSPHATE 75 MG PO CAPS: 75.0000 mg | ORAL_CAPSULE | Freq: Two times a day (BID) | ORAL | Status: DC

## 2015-02-28 NOTE — Telephone Encounter (Signed)
Call from [facility] about flu like symptoms and outbreak of flu at her work requesting tamiflu. Symptoms started today, tamiflu sent to her pharmacy.

## 2015-03-01 NOTE — Telephone Encounter (Signed)
Notified pt and she voices understanding. 

## 2015-03-01 NOTE — Telephone Encounter (Signed)
Please contact patient to follow up.  If she is having fever >101 or worsening symptoms should be evaluated in office.  Also call us if not feeling better in 3-4 days.

## 2015-03-06 ENCOUNTER — Ambulatory Visit (HOSPITAL_BASED_OUTPATIENT_CLINIC_OR_DEPARTMENT_OTHER)
Admission: RE | Admit: 2015-03-06 | Discharge: 2015-03-06 | Disposition: A | Discharge status: Home / Self Care | Payer: BLUE CROSS/BLUE SHIELD | Source: Ambulatory Visit | Attending: Family | Admitting: Family

## 2015-03-06 ENCOUNTER — Encounter: Payer: Self-pay | Admitting: Family

## 2015-03-06 ENCOUNTER — Ambulatory Visit (INDEPENDENT_AMBULATORY_CARE_PROVIDER_SITE_OTHER): Payer: BLUE CROSS/BLUE SHIELD | Admitting: Family

## 2015-03-06 VITALS — BP 143/72 | HR 82 | Temp 98.5°F | Resp 16 | Ht 62.25 in | Wt 119.0 lb

## 2015-03-06 DIAGNOSIS — I10 Essential (primary) hypertension: Secondary | ICD-10-CM | POA: Diagnosis not present

## 2015-03-06 DIAGNOSIS — J111 Influenza due to unidentified influenza virus with other respiratory manifestations: Secondary | ICD-10-CM | POA: Diagnosis not present

## 2015-03-06 DIAGNOSIS — R05 Cough: Secondary | ICD-10-CM | POA: Insufficient documentation

## 2015-03-06 DIAGNOSIS — Z87891 Personal history of nicotine dependence: Secondary | ICD-10-CM | POA: Insufficient documentation

## 2015-03-06 DIAGNOSIS — R079 Chest pain, unspecified: Secondary | ICD-10-CM | POA: Diagnosis not present

## 2015-03-06 DIAGNOSIS — R059 Cough, unspecified: Secondary | ICD-10-CM

## 2015-03-06 MED ORDER — PREDNISONE 10 MG PO TABS: ORAL_TABLET | ORAL | Status: DC

## 2015-03-06 MED ORDER — ALBUTEROL SULFATE HFA 108 (90 BASE) MCG/ACT IN AERS: 2.0000 | INHALATION_SPRAY | Freq: Four times a day (QID) | RESPIRATORY_TRACT | Status: DC | PRN

## 2015-03-06 MED ORDER — ALBUTEROL SULFATE (2.5 MG/3ML) 0.083% IN NEBU
2.5000 mg | INHALATION_SOLUTION | Freq: Once | RESPIRATORY_TRACT | Status: AC
  Administered 2015-03-06: 2.5 mg via RESPIRATORY_TRACT

## 2015-03-06 MED ORDER — AZITHROMYCIN 250 MG PO TABS: ORAL_TABLET | ORAL | Status: DC

## 2015-03-06 NOTE — Progress Notes (Signed)
Subjective:    Patient ID: Samantha Walker, female    DOB: August 29, 1955, 60 y.o.   MRN: [id number]  HPI  Ms. Trudgeon is a 60 yr old female who presents today with chief complaint of fever. She contacted [facility] on 02/28/15 re: flu outbreak at her work and an rx was sent for tamiflu. She reports poor energy, weakness, chest feels heavy, + cough.  She had some tea this AM and kept it down.  Vomited last night. Last known fever was on 2/15- Tmax 101.    Review of Systems    see HPI  Past Medical History  Diagnosis Date  . Arthritis   . History of chicken pox   . Hypertension   . Osteopenia 06/30/2012  . Allergy   . Hx of adenomatous polyp of colon 06/25/2014    Social History   Social History  . Marital Status: Married    Spouse Name: N/A  . Number of Children: N/A  . Years of Education: N/A   Occupational History  . Not on file.   Social History Main Topics  . Smoking status: Current Some Day Smoker    Types: Cigarettes    Last Attempt to Quit: 01/27/2012  . Smokeless tobacco: Never Used     Comment: 0-2 cigarettes a day  . Alcohol Use: 0.0 oz/week    0 Standard drinks or equivalent per week     Comment: occasionally  . Drug Use: Yes    Special: Marijuana  . Sexual Activity: Not on file   Other Topics Concern  . Not on file   Social History Narrative   Rare use of marijuana   Works as Training and development officer at [employer]   She has 2 grown sons- 4 grandchildren   Married   Completed 7th grade.            Past Surgical History  Procedure Laterality Date  . Cesarean section  1980  . Hand surgery  2007    carpal tunel release-left hand  . Hand surgery  2007    carpal tunel release--right hand    Family History  Problem Relation Age of Onset  . Arthritis Mother   . Cancer Mother     breast age 26  . Hypertension Mother   . Seizures Mother   . Breast cancer Mother   . Hypertension Father   . Cancer Sister 4    breast   . Hypertension Sister   . Breast  cancer Sister   . Hypertension Paternal Uncle   . Parkinson's disease Maternal Grandmother   . Colon cancer Neg Hx   . Rectal cancer Neg Hx   . Stomach cancer Neg Hx   . Esophageal cancer Neg Hx     No Known Allergies  Current Outpatient Prescriptions on File Prior to Visit  Medication Sig Dispense Refill  . acetaminophen (TYLENOL) 500 MG tablet Take 1,000-1,500 mg by mouth every 6 (six) hours as needed (for pain).    . Calcium Carbonate-Vitamin D 600-400 MG-UNIT per tablet Take 1 tablet by mouth daily.    Marland Kitchen lisinopril-hydrochlorothiazide (PRINZIDE,ZESTORETIC) 10-12.5 MG tablet Take 1 tablet by mouth daily. 90 tablet 1  . Multiple Vitamins-Calcium (ONE-A-DAY WOMENS FORMULA PO) Take 1 tablet by mouth daily.    Marland Kitchen oseltamivir (TAMIFLU) 75 MG capsule Take 1 capsule (75 mg total) by mouth 2 (two) times daily. 10 capsule 0  . aspirin 81 MG tablet Take 81 mg by mouth daily. Reported on  03/06/2015     No current facility-administered medications on file prior to visit.    BP 143/72 mmHg  Pulse 82  Temp(Src) 98.5 F (36.9 C) (Oral)  Resp 16  Ht 5' 2.25" (1.581 m)  Wt 119 lb (53.978 kg)  BMI 21.59 kg/m2  SpO2 100%    Objective:   Physical Exam  Constitutional: She is oriented to person, place, and time. She appears well-developed and well-nourished.  HENT:  Head: Normocephalic and atraumatic.  Cardiovascular: Normal rate, regular rhythm and normal heart sounds.   No murmur heard. Pulmonary/Chest: She has wheezes.  Scattered rales, bilateral expiratory wheeze. Mild tachypnea    Musculoskeletal: She exhibits no edema.  Lymphadenopathy:    She has no cervical adenopathy.  Neurological: She is alert and oriented to person, place, and time.  Skin: Skin is warm and dry.  Psychiatric: She has a normal mood and affect. Her behavior is normal. Judgment and thought content normal.          Assessment & Plan:  Influenza- Completed tamiflu  Bronchitis-  CXR negative for PNA.  Rx  with zpak, prednisone, albuterol. Pt advised to call if your symptoms worsen, if you develop fever >101 or if you are not feeling better in 3 days.

## 2015-03-06 NOTE — Progress Notes (Signed)
Pre visit review using our clinic review tool, if applicable. No additional management support is needed unless otherwise documented below in the visit note. 

## 2015-03-06 NOTE — Patient Instructions (Signed)
You have bronchitis and asthma flare. Start Zpak (antibiotic). Start albuterol 2 puffs every 6 hours for next few days until cough/breathing is improved then as needed.  Start prednisone.  Call if your symptoms worsen, if you develop fever >101 or if you are not feeling better in 3 days.

## 2015-04-10 ENCOUNTER — Encounter: Payer: Self-pay | Admitting: Family

## 2015-04-10 ENCOUNTER — Ambulatory Visit (INDEPENDENT_AMBULATORY_CARE_PROVIDER_SITE_OTHER): Payer: BLUE CROSS/BLUE SHIELD | Admitting: Family

## 2015-04-10 VITALS — BP 138/66 | HR 101 | Temp 98.1°F | Resp 16 | Ht 62.25 in | Wt 124.0 lb

## 2015-04-10 DIAGNOSIS — L03113 Cellulitis of right upper limb: Secondary | ICD-10-CM

## 2015-04-10 DIAGNOSIS — L237 Allergic contact dermatitis due to plants, except food: Secondary | ICD-10-CM

## 2015-04-10 MED ORDER — PREDNISONE 10 MG PO TABS: ORAL_TABLET | ORAL | Status: DC

## 2015-04-10 MED ORDER — CEPHALEXIN 500 MG PO CAPS: 500.0000 mg | ORAL_CAPSULE | Freq: Three times a day (TID) | ORAL | Status: DC

## 2015-04-10 MED ORDER — METHYLPREDNISOLONE SODIUM SUCC 125 MG IJ SOLR
125.0000 mg | Freq: Once | INTRAMUSCULAR | Status: AC
  Administered 2015-04-10: 125 mg via INTRAVENOUS

## 2015-04-10 NOTE — Progress Notes (Signed)
Subjective:    Patient ID: Samantha Walker, female    DOB: August 31, 1955, 60 y.o.   MRN: [id number]  HPI  Samantha Walker is a 60 yr old female who presents today with chief complaint of pruritic rash on right arm.  She reports that rash began on Saturday.  Reports associated swelling.  Rash has now spread to face.  She reports that she had poison Ivy exposure in her yard.   Review of Systems See HPI  Past Medical History  Diagnosis Date  . Arthritis   . History of chicken pox   . Hypertension   . Osteopenia 06/30/2012  . Allergy   . Hx of adenomatous polyp of colon 06/25/2014    Social History   Social History  . Marital Status: Married    Spouse Name: N/A  . Number of Children: N/A  . Years of Education: N/A   Occupational History  . Not on file.   Social History Main Topics  . Smoking status: Current Some Day Smoker    Types: Cigarettes    Last Attempt to Quit: 01/27/2012  . Smokeless tobacco: Never Used     Comment: 0-2 cigarettes a day  . Alcohol Use: 0.0 oz/week    0 Standard drinks or equivalent per week     Comment: occasionally  . Drug Use: Yes    Special: Marijuana  . Sexual Activity: Not on file   Other Topics Concern  . Not on file   Social History Narrative   Rare use of marijuana   Works as Training and development officer at [employer]   She has 2 grown sons- 4 grandchildren   Married   Completed 7th grade.            Past Surgical History  Procedure Laterality Date  . Cesarean section  1980  . Hand surgery  2007    carpal tunel release-left hand  . Hand surgery  2007    carpal tunel release--right hand    Family History  Problem Relation Age of Onset  . Arthritis Mother   . Cancer Mother     breast age 85  . Hypertension Mother   . Seizures Mother   . Breast cancer Mother   . Hypertension Father   . Cancer Sister 46    breast   . Hypertension Sister   . Breast cancer Sister   . Hypertension Paternal Uncle   . Parkinson's disease Maternal Grandmother    . Colon cancer Neg Hx   . Rectal cancer Neg Hx   . Stomach cancer Neg Hx   . Esophageal cancer Neg Hx     No Known Allergies  Current Outpatient Prescriptions on File Prior to Visit  Medication Sig Dispense Refill  . acetaminophen (TYLENOL) 500 MG tablet Take 1,000-1,500 mg by mouth every 6 (six) hours as needed (for pain).    Marland Kitchen albuterol (PROVENTIL HFA;VENTOLIN HFA) 108 (90 Base) MCG/ACT inhaler Inhale 2 puffs into the lungs every 6 (six) hours as needed for wheezing or shortness of breath. 1 Inhaler 0  . aspirin 81 MG tablet Take 81 mg by mouth daily. Reported on 03/06/2015    . Calcium Carbonate-Vitamin D 600-400 MG-UNIT per tablet Take 1 tablet by mouth daily.    Marland Kitchen lisinopril-hydrochlorothiazide (PRINZIDE,ZESTORETIC) 10-12.5 MG tablet Take 1 tablet by mouth daily. 90 tablet 1  . Multiple Vitamins-Calcium (ONE-A-DAY WOMENS FORMULA PO) Take 1 tablet by mouth daily.     No current facility-administered medications on file  prior to visit.    BP 138/66 mmHg  Pulse 101  Temp(Src) 98.1 F (36.7 C) (Oral)  Resp 16  Ht 5' 2.25" (1.581 m)  Wt 124 lb (56.246 kg)  BMI 22.50 kg/m2  SpO2 98%       Objective:   Physical Exam  Constitutional: She is oriented to person, place, and time. She appears well-developed and well-nourished.  Cardiovascular:  No murmur heard. Pulses:      Radial pulses are 2+ on the right side.  Brisk cap refill right hand.   Pulmonary/Chest: No respiratory distress. She has no wheezes.  Neurological: She is alert and oriented to person, place, and time.  Skin: Skin is warm and dry.  R forearm is edematous, + fine blistered rash on right forearm and above the right lip. Mild associated erythema right forearm   Psychiatric: She has a normal mood and affect. Her behavior is normal. Judgment and thought content normal.          Assessment & Plan:  Poison Ivy Dermatitis/cellulitis- New.  Appears to also have an associated cellulitis.  IM solumedrol 125mg   given today in the office.  Will follow with a prednisone taper and keflex.  She is advised to go to the ED if she develops severe/worsening swelling of the forearm.  Otherwise, she is to follow back up in the office in 2 days for re-evaluation.

## 2015-04-10 NOTE — Patient Instructions (Addendum)
Start Keflex (antibiotic) Start prednisone (you can begin tomorrow AM) For itching, you may use benadryl.  If severe/worsening swelling of the right arm, please go to the ER.

## 2015-04-10 NOTE — Progress Notes (Signed)
Pre visit review using our clinic review tool, if applicable. No additional management support is needed unless otherwise documented below in the visit note. 

## 2015-04-12 ENCOUNTER — Ambulatory Visit (INDEPENDENT_AMBULATORY_CARE_PROVIDER_SITE_OTHER): Payer: BLUE CROSS/BLUE SHIELD | Admitting: Family

## 2015-04-12 ENCOUNTER — Encounter: Payer: Self-pay | Admitting: Family

## 2015-04-12 VITALS — BP 146/84 | HR 67 | Temp 97.8°F | Resp 16 | Ht 62.25 in | Wt 123.0 lb

## 2015-04-12 DIAGNOSIS — L03113 Cellulitis of right upper limb: Secondary | ICD-10-CM

## 2015-04-12 DIAGNOSIS — L237 Allergic contact dermatitis due to plants, except food: Secondary | ICD-10-CM | POA: Diagnosis not present

## 2015-04-12 NOTE — Addendum Note (Signed)
Addended by: Debbrah Alar on: 04/12/2015 03:10 PM   Modules accepted: Level of Service

## 2015-04-12 NOTE — Progress Notes (Signed)
Pre visit review using our clinic review tool, if applicable. No additional management support is needed unless otherwise documented below in the visit note. 

## 2015-04-12 NOTE — Progress Notes (Signed)
Subjective:    Patient ID: Samantha Walker, female    DOB: 1955-11-21, 60 y.o.   MRN: [id number]  HPI  Samantha Walker is a 60 yr old female who presents today for follow up of her right arm poison ivy/cellulitis.   She was seen on 3/27 with severe swelling/itching due to poison ivy. She was treated with IM solumedrol, oral prednisone and oral keflex.  She reports improvement in the swelling and itching.    Review of Systems See HPI  Past Medical History  Diagnosis Date  . Arthritis   . History of chicken pox   . Hypertension   . Osteopenia 06/30/2012  . Allergy   . Hx of adenomatous polyp of colon 06/25/2014    Social History   Social History  . Marital Status: Married    Spouse Name: N/A  . Number of Children: N/A  . Years of Education: N/A   Occupational History  . Not on file.   Social History Main Topics  . Smoking status: Current Some Day Smoker    Types: Cigarettes    Last Attempt to Quit: 01/27/2012  . Smokeless tobacco: Never Used     Comment: 0-2 cigarettes a day  . Alcohol Use: 0.0 oz/week    0 Standard drinks or equivalent per week     Comment: occasionally  . Drug Use: Yes    Special: Marijuana  . Sexual Activity: Not on file   Other Topics Concern  . Not on file   Social History Narrative   Rare use of marijuana   Works as Training and development officer at [employer]   She has 2 grown sons- 4 grandchildren   Married   Completed 7th grade.            Past Surgical History  Procedure Laterality Date  . Cesarean section  1980  . Hand surgery  2007    carpal tunel release-left hand  . Hand surgery  2007    carpal tunel release--right hand    Family History  Problem Relation Age of Onset  . Arthritis Mother   . Cancer Mother     breast age 37  . Hypertension Mother   . Seizures Mother   . Breast cancer Mother   . Hypertension Father   . Cancer Sister 9    breast   . Hypertension Sister   . Breast cancer Sister   . Hypertension Paternal Uncle   .  Parkinson's disease Maternal Grandmother   . Colon cancer Neg Hx   . Rectal cancer Neg Hx   . Stomach cancer Neg Hx   . Esophageal cancer Neg Hx     No Known Allergies  Current Outpatient Prescriptions on File Prior to Visit  Medication Sig Dispense Refill  . acetaminophen (TYLENOL) 500 MG tablet Take 1,000-1,500 mg by mouth every 6 (six) hours as needed (for pain).    Marland Kitchen albuterol (PROVENTIL HFA;VENTOLIN HFA) 108 (90 Base) MCG/ACT inhaler Inhale 2 puffs into the lungs every 6 (six) hours as needed for wheezing or shortness of breath. 1 Inhaler 0  . aspirin 81 MG tablet Take 81 mg by mouth daily. Reported on 03/06/2015    . Calcium Carbonate-Vitamin D 600-400 MG-UNIT per tablet Take 1 tablet by mouth daily.    . cephALEXin (KEFLEX) 500 MG capsule Take 1 capsule (500 mg total) by mouth 3 (three) times daily. 21 capsule 0  . lisinopril-hydrochlorothiazide (PRINZIDE,ZESTORETIC) 10-12.5 MG tablet Take 1 tablet by mouth daily. [place]  tablet 1  . Multiple Vitamins-Calcium (ONE-A-DAY WOMENS FORMULA PO) Take 1 tablet by mouth daily.    . predniSONE (DELTASONE) 10 MG tablet 4 tabs by mouth once daily for 5 days 20 tablet 0   No current facility-administered medications on file prior to visit.    BP 146/84 mmHg  Pulse 67  Temp(Src) 97.8 F (36.6 C) (Oral)  Resp 16  Ht 5' 2.25" (1.581 m)  Wt 123 lb (55.792 kg)  BMI 22.32 kg/m2  SpO2 99%       Objective:   Physical Exam  Constitutional: She is oriented to person, place, and time. She appears well-developed and well-nourished.  HENT:  Head: Normocephalic and atraumatic.  Cardiovascular: Normal rate, regular rhythm and normal heart sounds.   No murmur heard. Pulmonary/Chest: Effort normal and breath sounds normal. No respiratory distress. She has no wheezes.  Musculoskeletal:  Right forearm remains swollen but is greatly improved.   Neurological: She is alert and oriented to person, place, and time.  Skin:  Minimal erythema right forearm,  fine blistering rash noted  Psychiatric: She has a normal mood and affect. Her behavior is normal. Judgment and thought content normal.          Assessment & Plan:  Poison Ivy/RUE cellulitis-  Improving. Advised pt to complete keflex and pred taper. Call if symptoms worsen or if symptoms do not continue to improve.

## 2015-04-12 NOTE — Patient Instructions (Signed)
Complete keflex and prednisone. Call if symptoms do not continue to improve.

## 2015-04-17 ENCOUNTER — Ambulatory Visit (INDEPENDENT_AMBULATORY_CARE_PROVIDER_SITE_OTHER): Payer: BLUE CROSS/BLUE SHIELD | Admitting: Family

## 2015-04-17 ENCOUNTER — Telehealth: Payer: Self-pay | Admitting: *Deleted

## 2015-04-17 ENCOUNTER — Encounter: Payer: Self-pay | Admitting: Family

## 2015-04-17 VITALS — BP 146/78 | HR 78 | Temp 97.8°F | Resp 16 | Ht 62.25 in | Wt 122.8 lb

## 2015-04-17 DIAGNOSIS — L237 Allergic contact dermatitis due to plants, except food: Secondary | ICD-10-CM

## 2015-04-17 MED ORDER — BETAMETHASONE DIPROPIONATE 0.05 % EX CREA: TOPICAL_CREAM | Freq: Two times a day (BID) | CUTANEOUS | Status: DC

## 2015-04-17 MED ORDER — PREDNISONE 20 MG PO TABS: 20.0000 mg | ORAL_TABLET | Freq: Every day | ORAL | Status: DC

## 2015-04-17 NOTE — Telephone Encounter (Signed)
[facility] note received via fax  Call:   Date: 04/17/15 Time: 0646   Caller: Self Return number: [phone]  Nurse: Larita Fife  Chief Complaint: [place], or [facility]  Reason for call: Caller states she has poison ivy on her left arm and states that she has it on her right arm. States she saw her PCP on Monday and was given a shot and was getting better and went back to see the doctor on Wednesday for follow-up. States the poison ivy is on her fence. States that it started coming back on Saturday evening.  Related visit to physician within the last 2 weeks: Yes  Guideline: Poison Ivy-[place] SEVERE itching (e.g. Interferes with sleep or normal activities)  Disposition: See Physician within 24 Hours  **Pt was seen in office today at 8:45am**

## 2015-04-17 NOTE — Progress Notes (Signed)
Subjective:    Patient ID: Samantha Walker, female    DOB: 1955-11-23, 60 y.o.   MRN: [id number]  HPI  Samantha Walker is a 60 yr old female who presents today with chief complaint of recurrent poison ivy on her right forearm.  She completed a 5 day course of prednisone and a 7 day course of keflex for possible cellulitis. She reports increased itching the last few days.    Review of Systems See HPI  Past Medical History  Diagnosis Date  . Arthritis   . History of chicken pox   . Hypertension   . Osteopenia 06/30/2012  . Allergy   . Hx of adenomatous polyp of colon 06/25/2014    Social History   Social History  . Marital Status: Married    Spouse Name: N/A  . Number of Children: N/A  . Years of Education: N/A   Occupational History  . Not on file.   Social History Main Topics  . Smoking status: Current Some Day Smoker    Types: Cigarettes    Last Attempt to Quit: 01/27/2012  . Smokeless tobacco: Never Used     Comment: 0-2 cigarettes a day  . Alcohol Use: 0.0 oz/week    0 Standard drinks or equivalent per week     Comment: occasionally  . Drug Use: Yes    Special: Marijuana  . Sexual Activity: Not on file   Other Topics Concern  . Not on file   Social History Narrative   Rare use of marijuana   Works as Training and development officer at [employer]   She has 2 grown sons- 4 grandchildren   Married   Completed 7th grade.            Past Surgical History  Procedure Laterality Date  . Cesarean section  1980  . Hand surgery  2007    carpal tunel release-left hand  . Hand surgery  2007    carpal tunel release--right hand    Family History  Problem Relation Age of Onset  . Arthritis Mother   . Cancer Mother     breast age 73  . Hypertension Mother   . Seizures Mother   . Breast cancer Mother   . Hypertension Father   . Cancer Sister 78    breast   . Hypertension Sister   . Breast cancer Sister   . Hypertension Paternal Uncle   . Parkinson's disease Maternal  Grandmother   . Colon cancer Neg Hx   . Rectal cancer Neg Hx   . Stomach cancer Neg Hx   . Esophageal cancer Neg Hx     No Known Allergies  Current Outpatient Prescriptions on File Prior to Visit  Medication Sig Dispense Refill  . acetaminophen (TYLENOL) 500 MG tablet Take 1,000-1,500 mg by mouth every 6 (six) hours as needed (for pain).    Marland Kitchen albuterol (PROVENTIL HFA;VENTOLIN HFA) 108 (90 Base) MCG/ACT inhaler Inhale 2 puffs into the lungs every 6 (six) hours as needed for wheezing or shortness of breath. 1 Inhaler 0  . aspirin 81 MG tablet Take 81 mg by mouth daily. Reported on 03/06/2015    . Calcium Carbonate-Vitamin D 600-400 MG-UNIT per tablet Take 1 tablet by mouth daily.    Marland Kitchen lisinopril-hydrochlorothiazide (PRINZIDE,ZESTORETIC) 10-12.5 MG tablet Take 1 tablet by mouth daily. 90 tablet 1  . Multiple Vitamins-Calcium (ONE-A-DAY WOMENS FORMULA PO) Take 1 tablet by mouth daily.     No current facility-administered medications on file prior  to visit.    BP 146/78 mmHg  Pulse 78  Temp(Src) 97.8 F (36.6 C) (Oral)  Resp 16  Ht 5' 2.25" (1.581 m)  Wt 122 lb 12.8 oz (55.702 kg)  BMI 22.28 kg/m2  SpO2 98%       Objective:   Physical Exam  Constitutional: She is oriented to person, place, and time. She appears well-developed and well-nourished. No distress.  Neurological: She is alert and oriented to person, place, and time.  Skin:  Right forearm with raised dry pustular lesions. No significant erythema noted.   Psychiatric: She has a normal mood and affect. Her behavior is normal. Judgment and thought content normal.          Assessment & Plan:  Poison Ivy Dermatitis- I actually think that the rash looks better.  I think that she is itching more because she completed the prednisone. Will extend prednisone for a few more days. Advised pt to add zyrtec once daily to help with pruritis. Will also add a topical steroid cream.  Pt is advised to call if symptoms worsen or if  symptoms do not continue to improve.  Pt verbalizes understanding.

## 2015-04-17 NOTE — Progress Notes (Signed)
Pre visit review using our clinic review tool, if applicable. No additional management support is needed unless otherwise documented below in the visit note. 

## 2015-04-17 NOTE — Patient Instructions (Addendum)
Add Zyrtec 10mg  once daily until rash/itching resolved.  Start prednisone 20mg  once daily for 4 more days. Start betamethasone cream twice daily.   Call if symptoms worsen or if symptoms do not continue to improve.

## 2015-07-21 ENCOUNTER — Other Ambulatory Visit: Payer: Self-pay | Admitting: Family

## 2015-07-21 DIAGNOSIS — Z1231 Encounter for screening mammogram for malignant neoplasm of breast: Secondary | ICD-10-CM

## 2015-08-03 ENCOUNTER — Ambulatory Visit (HOSPITAL_BASED_OUTPATIENT_CLINIC_OR_DEPARTMENT_OTHER)
Admission: RE | Admit: 2015-08-03 | Discharge: 2015-08-03 | Disposition: A | Discharge status: Home / Self Care | Payer: BLUE CROSS/BLUE SHIELD | Source: Ambulatory Visit | Attending: Family | Admitting: Family

## 2015-08-03 DIAGNOSIS — Z1231 Encounter for screening mammogram for malignant neoplasm of breast: Secondary | ICD-10-CM | POA: Insufficient documentation

## 2016-02-27 ENCOUNTER — Ambulatory Visit: Payer: BLUE CROSS/BLUE SHIELD | Admitting: Family

## 2016-03-05 ENCOUNTER — Encounter: Payer: Self-pay | Admitting: Family

## 2016-03-05 ENCOUNTER — Ambulatory Visit (INDEPENDENT_AMBULATORY_CARE_PROVIDER_SITE_OTHER): Payer: Self-pay | Admitting: Family

## 2016-03-05 VITALS — BP 154/74 | HR 81 | Temp 98.4°F | Resp 100 | Ht 62.0 in | Wt 123.8 lb

## 2016-03-05 DIAGNOSIS — M8580 Other specified disorders of bone density and structure, unspecified site: Secondary | ICD-10-CM

## 2016-03-05 DIAGNOSIS — I10 Essential (primary) hypertension: Secondary | ICD-10-CM

## 2016-03-05 DIAGNOSIS — R739 Hyperglycemia, unspecified: Secondary | ICD-10-CM

## 2016-03-05 LAB — BASIC METABOLIC PANEL
BUN: 9 mg/dL (ref 6–23)
CALCIUM: 9.8 mg/dL (ref 8.4–10.5)
CO2: 30 meq/L (ref 19–32)
Chloride: 108 mEq/L (ref 96–112)
Creatinine, Ser: 0.77 mg/dL (ref 0.40–1.20)
GFR: 98.41 mL/min (ref >60.00)
Glucose, Bld: 93 mg/dL (ref 70–99)
Potassium: 3.8 mEq/L (ref 3.5–5.1)
SODIUM: 142 meq/L (ref 135–145)

## 2016-03-05 LAB — HEMOGLOBIN A1C: HEMOGLOBIN A1C: 5.5 % (ref 4.6–6.5)

## 2016-03-05 MED ORDER — LISINOPRIL-HYDROCHLOROTHIAZIDE 10-12.5 MG PO TABS: 1.0000 | ORAL_TABLET | Freq: Every day | ORAL | 1 refills | Status: DC

## 2016-03-05 NOTE — Progress Notes (Signed)
Subjective:    Patient ID: Samantha Walker, female    DOB: [birth year], 61 y.o.   MRN: [id number]  HPI  Samantha Walker is a 61 yr old female who presents today for follow up.  1) HTN- Reports taht she has been taking her blood pressure medication only every other day.  BP Readings from Last 3 Encounters:  03/05/16 (!) 154/74  04/17/15 (!) 146/78  04/12/15 (!) 146/84   2) Hyperglycemia-  Lab Results  Component Value Date   HGBA1C 5.5 02/09/2014   3) Osteopenia- continues caltrate.    Review of Systems See HPI  Past Medical History:  Diagnosis Date  . Allergy   . Arthritis   . History of chicken pox   . Hx of adenomatous polyp of colon 06/25/2014  . Hypertension   . Osteopenia 06/30/2012     Social History   Social History  . Marital status: Married    Spouse name: N/A  . Number of children: N/A  . Years of education: N/A   Occupational History  . Not on file.   Social History Main Topics  . Smoking status: Current Some Day Smoker    Types: Cigarettes    Last attempt to quit: 01/27/2012  . Smokeless tobacco: Never Used     Comment: 0-2 cigarettes a day  . Alcohol use 0.0 oz/week     Comment: occasionally  . Drug use: Yes    Types: Marijuana  . Sexual activity: Not on file   Other Topics Concern  . Not on file   Social History Narrative   Rare use of marijuana   Works as Training and development officer at [employer]   She has 2 grown sons- 4 grandchildren   Married   Completed 7th grade.            Past Surgical History:  Procedure Laterality Date  . CESAREAN SECTION  1980  . HAND SURGERY  2007   carpal tunel release-left hand  . HAND SURGERY  2007   carpal tunel release--right hand    Family History  Problem Relation Age of Onset  . Arthritis Mother   . Cancer Mother     breast age 24  . Hypertension Mother   . Seizures Mother   . Breast cancer Mother   . Hypertension Father   . Cancer Sister 38    breast   . Hypertension Sister   . Breast cancer Sister    . Hypertension Paternal Uncle   . Parkinson's disease Maternal Grandmother   . Colon cancer Neg Hx   . Rectal cancer Neg Hx   . Stomach cancer Neg Hx   . Esophageal cancer Neg Hx     No Known Allergies  Current Outpatient Prescriptions on File Prior to Visit  Medication Sig Dispense Refill  . acetaminophen (TYLENOL) 500 MG tablet Take 1,000-1,500 mg by mouth every 6 (six) hours as needed (for pain).    Marland Kitchen albuterol (PROVENTIL HFA;VENTOLIN HFA) 108 (90 Base) MCG/ACT inhaler Inhale 2 puffs into the lungs every 6 (six) hours as needed for wheezing or shortness of breath. 1 Inhaler 0  . aspirin 81 MG tablet Take 81 mg by mouth daily. Reported on 03/06/2015    . betamethasone dipropionate (DIPROLENE) 0.05 % cream Apply topically 2 (two) times daily. 30 g 0  . Calcium Carbonate-Vitamin D 600-400 MG-UNIT per tablet Take 1 tablet by mouth daily.    . Multiple Vitamins-Calcium (ONE-A-DAY WOMENS FORMULA PO) Take 1 tablet by  mouth daily.     No current facility-administered medications on file prior to visit.     BP (!) 154/74 (BP Location: Left Arm, Patient Position: Sitting, Cuff Size: Normal)   Pulse 81   Temp 98.4 F (36.9 C) (Oral)   Resp (!) 100   Ht 5\' 2"  (1.575 m)   Wt 123 lb 12.8 oz (56.2 kg)   BMI 22.64 kg/m       Objective:   Physical Exam  Constitutional: She is oriented to person, place, and time. She appears well-developed and well-nourished.  Cardiovascular: Normal rate, regular rhythm and normal heart sounds.   No murmur heard. Pulmonary/Chest: Effort normal and breath sounds normal. No respiratory distress. She has no wheezes.  Musculoskeletal: She exhibits no edema.  Neurological: She is alert and oriented to person, place, and time.  Psychiatric: She has a normal mood and affect. Her behavior is normal. Judgment and thought content normal.          Assessment & Plan:

## 2016-03-05 NOTE — Assessment & Plan Note (Signed)
bp is up. Will have her get back on a daily dosing regimen and plant to repeat bp in 1 month at her cpx.

## 2016-03-05 NOTE — Patient Instructions (Signed)
Please complete lab work prior to leaving.   

## 2016-03-05 NOTE — Assessment & Plan Note (Signed)
Continue caltrate.

## 2016-03-05 NOTE — Progress Notes (Signed)
Pre visit review using our clinic review tool, if applicable. No additional management support is needed unless otherwise documented below in the visit note. 

## 2016-03-05 NOTE — Assessment & Plan Note (Signed)
Obtain follow up a1c. Offered flu shot- she will get at [facility].

## 2016-04-02 ENCOUNTER — Encounter: Payer: BLUE CROSS/BLUE SHIELD | Admitting: Family

## 2016-04-23 ENCOUNTER — Ambulatory Visit (INDEPENDENT_AMBULATORY_CARE_PROVIDER_SITE_OTHER): Payer: BLUE CROSS/BLUE SHIELD | Admitting: Family

## 2016-04-23 ENCOUNTER — Other Ambulatory Visit (HOSPITAL_COMMUNITY)
Admission: RE | Admit: 2016-04-23 | Discharge: 2016-04-23 | Disposition: A | Discharge status: Home / Self Care | Payer: BLUE CROSS/BLUE SHIELD | Source: Ambulatory Visit | Attending: Family | Admitting: Family

## 2016-04-23 ENCOUNTER — Encounter: Payer: Self-pay | Admitting: Family

## 2016-04-23 VITALS — BP 139/86 | HR 71 | Temp 98.0°F | Resp 16 | Ht 62.0 in | Wt 120.0 lb

## 2016-04-23 DIAGNOSIS — Z0000 Encounter for general adult medical examination without abnormal findings: Secondary | ICD-10-CM

## 2016-04-23 DIAGNOSIS — E2839 Other primary ovarian failure: Secondary | ICD-10-CM

## 2016-04-23 DIAGNOSIS — M19012 Primary osteoarthritis, left shoulder: Secondary | ICD-10-CM

## 2016-04-23 DIAGNOSIS — M19011 Primary osteoarthritis, right shoulder: Secondary | ICD-10-CM | POA: Diagnosis not present

## 2016-04-23 DIAGNOSIS — Z01419 Encounter for gynecological examination (general) (routine) without abnormal findings: Secondary | ICD-10-CM

## 2016-04-23 LAB — BASIC METABOLIC PANEL
BUN: 8 mg/dL (ref 6–23)
CO2: 30 mEq/L (ref 19–32)
Calcium: 10 mg/dL (ref 8.4–10.5)
Chloride: 105 mEq/L (ref 96–112)
Creatinine, Ser: 0.75 mg/dL (ref 0.40–1.20)
GFR: 101.4 mL/min (ref >60.00)
Glucose, Bld: 87 mg/dL (ref 70–99)
POTASSIUM: 3.3 meq/L — AB (ref 3.5–5.1)
Sodium: 140 mEq/L (ref 135–145)

## 2016-04-23 LAB — LIPID PANEL
CHOL/HDL RATIO: 3
Cholesterol: 197 mg/dL (ref 0–200)
HDL: 70.7 mg/dL (ref >39.00)
LDL Cholesterol: 101 mg/dL — ABNORMAL HIGH (ref 0–99)
NONHDL: 126.31
TRIGLYCERIDES: 125 mg/dL (ref 0.0–149.0)
VLDL: 25 mg/dL (ref 0.0–40.0)

## 2016-04-23 LAB — CBC WITH DIFFERENTIAL/PLATELET
Basophils Absolute: 0.1 10*3/uL (ref 0.0–0.1)
Basophils Relative: 1.2 % (ref 0.0–3.0)
EOS ABS: 0.1 10*3/uL (ref 0.0–0.7)
EOS PCT: 0.8 % (ref 0.0–5.0)
HCT: 41.4 % (ref 36.0–46.0)
HEMOGLOBIN: 13.6 g/dL (ref 12.0–15.0)
Lymphocytes Relative: 24.7 % (ref 12.0–46.0)
Lymphs Abs: 1.7 10*3/uL (ref 0.7–4.0)
MCHC: 32.8 g/dL (ref 30.0–36.0)
MCV: 99 fl (ref 78.0–100.0)
MONO ABS: 0.4 10*3/uL (ref 0.1–1.0)
Monocytes Relative: 6.4 % (ref 3.0–12.0)
Neutro Abs: 4.5 10*3/uL (ref 1.4–7.7)
Neutrophils Relative %: 66.9 % (ref 43.0–77.0)
Platelets: 265 10*3/uL (ref 150.0–400.0)
RBC: 4.19 Mil/uL (ref 3.87–5.11)
RDW: 14.4 % (ref 11.5–15.5)
WBC: 6.8 10*3/uL (ref 4.0–10.5)

## 2016-04-23 LAB — HIV ANTIBODY (ROUTINE TESTING W REFLEX): HIV: NONREACTIVE

## 2016-04-23 LAB — HEPATIC FUNCTION PANEL
ALBUMIN: 4.8 g/dL (ref 3.5–5.2)
ALK PHOS: 62 U/L (ref 39–117)
ALT: 13 U/L (ref 0–35)
AST: 23 U/L (ref 0–37)
Bilirubin, Direct: 0.1 mg/dL (ref 0.0–0.3)
TOTAL PROTEIN: 7.6 g/dL (ref 6.0–8.3)
Total Bilirubin: 0.4 mg/dL (ref 0.2–1.2)

## 2016-04-23 LAB — URINALYSIS, ROUTINE W REFLEX MICROSCOPIC
BILIRUBIN URINE: NEGATIVE
Hgb urine dipstick: NEGATIVE
KETONES UR: NEGATIVE
LEUKOCYTES UA: NEGATIVE
Nitrite: NEGATIVE
PH: 7 (ref 5.0–8.0)
RBC / HPF: NONE SEEN (ref 0–?)
Specific Gravity, Urine: <=1.005 — AB (ref 1.000–1.030)
TOTAL PROTEIN, URINE-UPE24: NEGATIVE
UROBILINOGEN UA: 0.2 (ref 0.0–1.0)
Urine Glucose: NEGATIVE
WBC UA: NONE SEEN (ref 0–?)

## 2016-04-23 LAB — HEPATITIS C ANTIBODY: HCV AB: NEGATIVE

## 2016-04-23 LAB — TSH: TSH: 0.7 u[IU]/mL (ref 0.35–4.50)

## 2016-04-23 MED ORDER — MELOXICAM 7.5 MG PO TABS: 7.5000 mg | ORAL_TABLET | Freq: Every day | ORAL | 3 refills | Status: DC

## 2016-04-23 NOTE — Progress Notes (Signed)
Subjective:    Patient ID: Samantha Walker, female    DOB: 04/14/1955, 61 y.o.   MRN: [id number]  HPI  Patient presents today for complete physical.  Immunizations:  Tetanus 2/09 Diet:reports healthy diet Exercise: active in her yard, nor formal exercise Colonoscopy: 06/17/14 Dexa: 6/14 Pap Smear: 2/14 Mammogram: 7/17  Bilateral shoulder pain- not relieved by tylenol.    Review of Systems  Constitutional: Negative for unexpected weight change.  HENT: Negative for hearing loss and rhinorrhea.   Eyes: Negative for visual disturbance.  Respiratory: Negative for cough.   Cardiovascular: Negative for chest pain and leg swelling.  Gastrointestinal: Negative for blood in stool, constipation and diarrhea.  Genitourinary: Negative for dysuria, frequency and hematuria.  Musculoskeletal: Positive for arthralgias. Negative for myalgias.       Bilateral shoulder pain  Skin:       Eczema/arms  Neurological: Negative for headaches.  Hematological: Negative for adenopathy.  Psychiatric/Behavioral:       Denies depression/anxiety   Past Medical History:  Diagnosis Date  . Allergy   . Arthritis   . History of chicken pox   . Hx of adenomatous polyp of colon 06/25/2014  . Hypertension   . Osteopenia 06/30/2012     Social History   Social History  . Marital status: Married    Spouse name: N/A  . Number of children: N/A  . Years of education: N/A   Occupational History  . Not on file.   Social History Main Topics  . Smoking status: Current Some Day Smoker    Types: Cigarettes    Last attempt to quit: 01/27/2012  . Smokeless tobacco: Never Used     Comment: 0-2 cigarettes a day  . Alcohol use No  . Drug use: Yes    Types: Marijuana     Comment: occasional  . Sexual activity: Not on file   Other Topics Concern  . Not on file   Social History Narrative   Rare use of marijuana   Works as Training and development officer at [employer]   She has 2 grown sons- 4 grandchildren   Married   Completed 7th grade.            Past Surgical History:  Procedure Laterality Date  . CESAREAN SECTION  1980  . HAND SURGERY  2007   carpal tunel release-left hand  . HAND SURGERY  2007   carpal tunel release--right hand    Family History  Problem Relation Age of Onset  . Arthritis Mother   . Cancer Mother     breast age 43  . Hypertension Mother   . Seizures Mother   . Breast cancer Mother   . Hypertension Father   . Cancer Sister 70    breast   . Hypertension Sister   . Breast cancer Sister   . Hypertension Paternal Uncle   . Parkinson's disease Maternal Grandmother   . Colon cancer Neg Hx   . Rectal cancer Neg Hx   . Stomach cancer Neg Hx   . Esophageal cancer Neg Hx     No Known Allergies  Current Outpatient Prescriptions on File Prior to Visit  Medication Sig Dispense Refill  . acetaminophen (TYLENOL) 500 MG tablet Take 1,000-1,500 mg by mouth every 6 (six) hours as needed (for pain).    Marland Kitchen aspirin 81 MG tablet Take 81 mg by mouth daily. Reported on 03/06/2015    . Calcium Carbonate-Vitamin D 600-400 MG-UNIT per tablet Take 1 tablet by  mouth daily.    Marland Kitchen lisinopril-hydrochlorothiazide (PRINZIDE,ZESTORETIC) 10-12.5 MG tablet Take 1 tablet by mouth daily. 90 tablet 1  . Multiple Vitamins-Calcium (ONE-A-DAY WOMENS FORMULA PO) Take 1 tablet by mouth daily.     No current facility-administered medications on file prior to visit.     BP 139/86 (BP Location: Right Arm, Cuff Size: Normal)   Pulse 71   Temp 98 F (36.7 C)   Resp 16   Ht 5\' 2"  (1.575 m)   Wt 120 lb (54.4 kg)   SpO2 100%   BMI 21.95 kg/m       Objective:   Physical Exam  Physical Exam  Constitutional: She is oriented to person, place, and time. She appears well-developed and well-nourished. No distress.  HENT:  Head: Normocephalic and atraumatic.  Right Ear: Tympanic membrane and ear canal normal.  Left Ear: Tympanic membrane and ear canal normal.  Mouth/Throat: Oropharynx is clear and  moist.  Eyes: Pupils are equal, round, and reactive to light. No scleral icterus.  Neck: Normal range of motion. No thyromegaly present.  Cardiovascular: Normal rate and regular rhythm.   No murmur heard. Pulmonary/Chest: Effort normal and breath sounds normal. No respiratory distress. He has no wheezes. She has no rales. She exhibits no tenderness.  Abdominal: Soft. Bowel sounds are normal. She exhibits no distension and no mass. There is no tenderness. There is no rebound and no guarding.  Musculoskeletal: She exhibits no edema.  Lymphadenopathy:    She has no cervical adenopathy.  Neurological: She is alert and oriented to person, place, and time. She has normal patellar reflexes. She exhibits normal muscle tone. Coordination normal.  Skin: Skin is warm and dry.  Psychiatric: She has a normal mood and affect. Her behavior is normal. Judgment and thought content normal.  Breasts: Examined lying Right: Without masses, retractions, discharge or axillary adenopathy.  Left: Without masses, retractions, discharge or axillary adenopathy.  Inguinal/mons: Normal without inguinal adenopathy  External genitalia: Normal  BUS/Urethra/Skene's glands: Normal  Bladder: Normal  Vagina: Normal  Cervix: Normal  Uterus: normal in size, shape and contour. Midline and mobile  Adnexa/parametria:  Rt: Without masses or tenderness.  Lt: Without masses or tenderness.  Anus and perineum: Normal            Assessment & Plan:   Preventative Care- tetanus up to date- will need to be given later this year.  Discussed adding regular exercise, continue healthy diet.  Refer for mammo this summer, dexa.  Pap performed today.  Obtain routine lab work.  EKG is performed today and notes NSR. Compared to prior EKG and appears unchanged.   OA- shoulders- trial of meloxicam.       Assessment & Plan:

## 2016-04-23 NOTE — Patient Instructions (Signed)
Please complete lab work prior to leaving. Schedule mammogram and bone density on the first floor.

## 2016-04-23 NOTE — Addendum Note (Signed)
Addended by: Kelle Darting A on: 04/23/2016 11:33 AM   Modules accepted: Orders

## 2016-04-23 NOTE — Progress Notes (Signed)
Pre visit review using our clinic review tool, if applicable. No additional management support is needed unless otherwise documented below in the visit note. 

## 2016-04-25 ENCOUNTER — Telehealth: Payer: Self-pay | Admitting: Family

## 2016-04-25 DIAGNOSIS — E876 Hypokalemia: Secondary | ICD-10-CM

## 2016-04-25 LAB — CYTOLOGY - PAP
Diagnosis: NEGATIVE
HPV: NOT DETECTED

## 2016-04-25 NOTE — Telephone Encounter (Signed)
Relation to DQ:QIWL Call back number:[phone]   Reason for call:  Patient returning call regarding lab results

## 2016-04-26 ENCOUNTER — Encounter: Payer: Self-pay | Admitting: Family

## 2016-04-26 MED ORDER — POTASSIUM CHLORIDE CRYS ER 20 MEQ PO TBCR: 20.0000 meq | EXTENDED_RELEASE_TABLET | Freq: Every day | ORAL | 2 refills | Status: DC

## 2016-04-26 NOTE — Telephone Encounter (Signed)
-----   Message from Debbrah Alar, NP sent at 04/23/2016  2:56 PM EDT ----- K+ is low. Add due 20 meq one daily# 30 with 2 refills. Needs BMET in 1 week. Diagnosis hypokalemia.

## 2016-04-29 NOTE — Telephone Encounter (Signed)
Pt called and scheduled lab appt for repeat BMET 05/02/16. Please enter order.

## 2016-04-29 NOTE — Addendum Note (Signed)
Addended by: Kelle Darting A on: 04/29/2016 02:53 PM   Modules accepted: Orders

## 2016-04-29 NOTE — Telephone Encounter (Addendum)
Order placed. Spoke with pt and notified her of below result. Pt was not aware and did not start medication yet. Lab appt r/s for 05/09/16 at 9am.

## 2016-05-02 ENCOUNTER — Other Ambulatory Visit: Payer: BLUE CROSS/BLUE SHIELD

## 2016-05-09 ENCOUNTER — Other Ambulatory Visit (INDEPENDENT_AMBULATORY_CARE_PROVIDER_SITE_OTHER): Payer: BLUE CROSS/BLUE SHIELD

## 2016-05-09 DIAGNOSIS — E876 Hypokalemia: Secondary | ICD-10-CM

## 2016-05-09 LAB — BASIC METABOLIC PANEL
BUN: 11 mg/dL (ref 6–23)
CHLORIDE: 107 meq/L (ref 96–112)
CO2: 29 meq/L (ref 19–32)
CREATININE: 0.78 mg/dL (ref 0.40–1.20)
Calcium: 9.9 mg/dL (ref 8.4–10.5)
GFR: 96.9 mL/min (ref >60.00)
GLUCOSE: 102 mg/dL — AB (ref 70–99)
POTASSIUM: 3.6 meq/L (ref 3.5–5.1)
Sodium: 140 mEq/L (ref 135–145)

## 2016-05-10 ENCOUNTER — Encounter: Payer: Self-pay | Admitting: Family

## 2016-08-05 ENCOUNTER — Encounter: Payer: Self-pay | Admitting: Family

## 2016-08-05 ENCOUNTER — Ambulatory Visit (INDEPENDENT_AMBULATORY_CARE_PROVIDER_SITE_OTHER): Payer: BLUE CROSS/BLUE SHIELD | Admitting: Family

## 2016-08-05 VITALS — BP 144/82 | HR 118 | Temp 98.6°F | Resp 18 | Wt 112.8 lb

## 2016-08-05 DIAGNOSIS — E876 Hypokalemia: Secondary | ICD-10-CM

## 2016-08-05 DIAGNOSIS — L237 Allergic contact dermatitis due to plants, except food: Secondary | ICD-10-CM

## 2016-08-05 MED ORDER — CEPHALEXIN 500 MG PO CAPS: 500.0000 mg | ORAL_CAPSULE | Freq: Three times a day (TID) | ORAL | 0 refills | Status: DC

## 2016-08-05 MED ORDER — POTASSIUM CHLORIDE CRYS ER 20 MEQ PO TBCR: 20.0000 meq | EXTENDED_RELEASE_TABLET | Freq: Every day | ORAL | 1 refills | Status: AC

## 2016-08-05 MED ORDER — PREDNISONE 10 MG PO TABS: ORAL_TABLET | ORAL | 0 refills | Status: DC

## 2016-08-05 MED ORDER — LISINOPRIL-HYDROCHLOROTHIAZIDE 10-12.5 MG PO TABS: 1.0000 | ORAL_TABLET | Freq: Every day | ORAL | 1 refills | Status: AC

## 2016-08-05 NOTE — Patient Instructions (Signed)
Please begin predisone taper as well as Keflex for your poison ivy. Call if new/worsening symptoms or if not improved in 3-4 days.

## 2016-08-05 NOTE — Progress Notes (Signed)
Subjective:    Patient ID: Samantha Walker, female    DOB: [birth year], 61 y.o.   MRN: [id number]  HPI  Samantha Walker is a 61 yr old female who presents today with chief complaint of skin rash and pruritis.  She does like to garden and has 2 dogs.  Reports that she began to itch last Thursday. Face is beginning to itch as well.   Review of Systems See HPI  Past Medical History:  Diagnosis Date  . Allergy   . Arthritis   . History of chicken pox   . Hx of adenomatous polyp of colon 06/25/2014  . Hypertension   . Osteopenia 06/30/2012     Social History   Social History  . Marital status: Married    Spouse name: N/A  . Number of children: N/A  . Years of education: N/A   Occupational History  . Not on file.   Social History Main Topics  . Smoking status: Current Some Day Smoker    Types: Cigarettes    Last attempt to quit: 01/27/2012  . Smokeless tobacco: Never Used     Comment: 0-2 cigarettes a day  . Alcohol use No  . Drug use: Yes    Types: Marijuana     Comment: occasional  . Sexual activity: Not on file   Other Topics Concern  . Not on file   Social History Narrative   Rare use of marijuana   Works as Training and development officer at [employer]   She has 2 grown sons- 4 grandchildren   Married   Completed 7th grade.            Past Surgical History:  Procedure Laterality Date  . CESAREAN SECTION  1980  . HAND SURGERY  2007   carpal tunel release-left hand  . HAND SURGERY  2007   carpal tunel release--right hand    Family History  Problem Relation Age of Onset  . Arthritis Mother   . Cancer Mother        breast age 31  . Hypertension Mother   . Seizures Mother   . Breast cancer Mother   . Hypertension Father   . Cancer Sister 64       breast   . Hypertension Sister   . Breast cancer Sister   . Hypertension Paternal Uncle   . Parkinson's disease Maternal Grandmother   . Colon cancer Neg Hx   . Rectal cancer Neg Hx   . Stomach cancer Neg Hx   . Esophageal cancer  Neg Hx     No Known Allergies  Current Outpatient Prescriptions on File Prior to Visit  Medication Sig Dispense Refill  . acetaminophen (TYLENOL) 500 MG tablet Take 1,000-1,500 mg by mouth every 6 (six) hours as needed (for pain).    Marland Kitchen aspirin 81 MG tablet Take 81 mg by mouth daily. Reported on 03/06/2015    . Calcium Carbonate-Vitamin D 600-400 MG-UNIT per tablet Take 1 tablet by mouth daily.    . meloxicam (MOBIC) 7.5 MG tablet Take 1 tablet (7.5 mg total) by mouth daily. 30 tablet 3  . Multiple Vitamins-Calcium (ONE-A-DAY WOMENS FORMULA PO) Take 1 tablet by mouth daily.     No current facility-administered medications on file prior to visit.     BP (!) 144/82 (BP Location: Left Arm, Patient Position: Sitting, Cuff Size: Normal)   Pulse (!) 118   Temp 98.6 F (37 C) (Oral)   Resp 18   Wt 112 lb  12.8 oz (51.2 kg)   SpO2 100%   BMI 20.63 kg/m       Objective:   Physical Exam  Constitutional: She appears well-developed and well-nourished. No distress.  Skin: Skin is warm. Rash noted.  + blistered rash with some swelling and erythema noted on right forearm.  Psychiatric: She has a normal mood and affect. Her behavior is normal. Judgment and thought content normal.            Assessment & Plan:  Poison ivy- will rx with prednisone taper. I am concerned that she may be developing an associated cellulitis so I will cover her empirically with keflex. She is advised to call if new/worsening symptoms or if not improved in 3 days.  Follow up in 1 week for re-evaluation.

## 2016-08-06 ENCOUNTER — Other Ambulatory Visit (HOSPITAL_BASED_OUTPATIENT_CLINIC_OR_DEPARTMENT_OTHER): Payer: BLUE CROSS/BLUE SHIELD

## 2016-08-06 ENCOUNTER — Ambulatory Visit (HOSPITAL_BASED_OUTPATIENT_CLINIC_OR_DEPARTMENT_OTHER): Payer: BLUE CROSS/BLUE SHIELD

## 2016-08-12 ENCOUNTER — Encounter: Payer: Self-pay | Admitting: Family Medicine

## 2016-08-12 ENCOUNTER — Ambulatory Visit (INDEPENDENT_AMBULATORY_CARE_PROVIDER_SITE_OTHER): Payer: BLUE CROSS/BLUE SHIELD | Admitting: Family Medicine

## 2016-08-12 VITALS — BP 110/78 | HR 81 | Temp 98.5°F | Ht 62.0 in | Wt 116.5 lb

## 2016-08-12 DIAGNOSIS — L247 Irritant contact dermatitis due to plants, except food: Secondary | ICD-10-CM

## 2016-08-12 MED ORDER — METHYLPREDNISOLONE ACETATE 80 MG/ML IJ SUSP
80.0000 mg | Freq: Once | INTRAMUSCULAR | Status: AC
Start: 2016-08-12 — End: 2016-08-12
  Administered 2016-08-12: 80 mg via INTRAMUSCULAR

## 2016-08-12 MED ORDER — PREDNISONE 10 MG PO TABS: ORAL_TABLET | ORAL | 0 refills | Status: DC

## 2016-08-12 NOTE — Progress Notes (Signed)
Patient ID: Samantha Walker, female    DOB: 05/31/1955  Age: 61 y.o. MRN: [id number]    Subjective:  Subjective  HPI Samantha Walker presents for poison ivy f/u-- it is better but not completely and the itching is driving her crazy.    Review of Systems  Constitutional: Negative for appetite change, diaphoresis, fatigue and unexpected weight change.  Eyes: Negative for pain, redness and visual disturbance.  Respiratory: Negative for cough, chest tightness, shortness of breath and wheezing.   Cardiovascular: Negative for chest pain, palpitations and leg swelling.  Endocrine: Negative for cold intolerance, heat intolerance, polydipsia, polyphagia and polyuria.  Genitourinary: Negative for difficulty urinating, dysuria and frequency.  Skin: Positive for rash.  Neurological: Negative for dizziness, light-headedness, numbness and headaches.    History Past Medical History:  Diagnosis Date  . Allergy   . Arthritis   . History of chicken pox   . Hx of adenomatous polyp of colon 06/25/2014  . Hypertension   . Osteopenia 06/30/2012    She has a past surgical history that includes Cesarean section (1980); Hand surgery (2007); and Hand surgery (2007).   Her family history includes Arthritis in her mother; Breast cancer in her mother and sister; Cancer in her mother; Cancer (age of onset: 57) in her sister; Hypertension in her father, mother, paternal uncle, and sister; Parkinson's disease in her maternal grandmother; Seizures in her mother.She reports that she has been smoking Cigarettes.  She has never used smokeless tobacco. She reports that she uses drugs, including Marijuana. She reports that she does not drink alcohol.  Current Outpatient Prescriptions on File Prior to Visit  Medication Sig Dispense Refill  . acetaminophen (TYLENOL) 500 MG tablet Take 1,000-1,500 mg by mouth every 6 (six) hours as needed (for pain).    Marland Kitchen aspirin 81 MG tablet Take 81 mg by mouth daily. Reported on 03/06/2015    .  Calcium Carbonate-Vitamin D 600-400 MG-UNIT per tablet Take 1 tablet by mouth daily.    . cephALEXin (KEFLEX) 500 MG capsule Take 1 capsule (500 mg total) by mouth 3 (three) times daily. 21 capsule 0  . lisinopril-hydrochlorothiazide (PRINZIDE,ZESTORETIC) 10-12.5 MG tablet Take 1 tablet by mouth daily. 90 tablet 1  . meloxicam (MOBIC) 7.5 MG tablet Take 1 tablet (7.5 mg total) by mouth daily. 30 tablet 3  . Multiple Vitamins-Calcium (ONE-A-DAY WOMENS FORMULA PO) Take 1 tablet by mouth daily.    . potassium chloride SA (K-DUR,KLOR-CON) 20 MEQ tablet Take 1 tablet (20 mEq total) by mouth daily. 90 tablet 1   No current facility-administered medications on file prior to visit.      Objective:  Objective  Physical Exam  Constitutional: She is oriented to person, place, and time. She appears well-developed and well-nourished.  HENT:  Head: Normocephalic and atraumatic.  Eyes: Conjunctivae and EOM are normal.  Neck: Normal range of motion. Neck supple. No JVD present. Carotid bruit is not present. No thyromegaly present.  Cardiovascular: Normal rate, regular rhythm and normal heart sounds.   No murmur heard. Pulmonary/Chest: Effort normal and breath sounds normal. No respiratory distress. She has no wheezes. She has no rales. She exhibits no tenderness.  Musculoskeletal: She exhibits no edema.  Neurological: She is alert and oriented to person, place, and time.  Skin: Rash noted. Rash is papular and vesicular.     Psychiatric: She has a normal mood and affect.  Nursing note and vitals reviewed.  BP 110/78 (BP Location: Left Arm, Patient Position: Sitting, Cuff  Size: Normal)   Pulse 81   Temp 98.5 F (36.9 C) (Oral)   Ht 5\' 2"  (1.575 m)   Wt 116 lb 8 oz (52.8 kg)   SpO2 99%   BMI 21.31 kg/m  Wt Readings from Last 3 Encounters:  08/12/16 116 lb 8 oz (52.8 kg)  08/05/16 112 lb 12.8 oz (51.2 kg)  04/23/16 120 lb (54.4 kg)     Lab Results  Component Value Date   WBC 6.8 04/23/2016    HGB 13.6 04/23/2016   HCT 41.4 04/23/2016   PLT 265.0 04/23/2016   GLUCOSE 102 (H) 05/09/2016   CHOL 197 04/23/2016   TRIG 125.0 04/23/2016   HDL 70.70 04/23/2016   LDLCALC 101 (H) 04/23/2016   ALT 13 04/23/2016   AST 23 04/23/2016   NA 140 05/09/2016   K 3.6 05/09/2016   CL 107 05/09/2016   CREATININE 0.78 05/09/2016   BUN 11 05/09/2016   CO2 29 05/09/2016   TSH 0.70 04/23/2016   HGBA1C 5.5 03/05/2016    No results found.   Assessment & Plan:  Plan  I have changed Ms. Prouse's predniSONE. I am also having her maintain her Multiple Vitamins-Calcium (ONE-A-DAY WOMENS FORMULA PO), aspirin, Calcium Carbonate-Vitamin D, acetaminophen, meloxicam, lisinopril-hydrochlorothiazide, potassium chloride SA, and cephALEXin. We administered methylPREDNISolone acetate.  Meds ordered this encounter  Medications  . predniSONE (DELTASONE) 10 MG tablet    Sig: 2 a day for 2 more days then 1 a day for 3 days    Dispense:  7 tablet    Refill:  0  . methylPREDNISolone acetate (DEPO-MEDROL) injection 80 mg    Problem List Items Addressed This Visit    None    Visit Diagnoses    Contact dermatitis and eczema due to plant    -  Primary   Relevant Medications   predniSONE (DELTASONE) 10 MG tablet   methylPREDNISolone acetate (DEPO-MEDROL) injection 80 mg (Completed)    will extend the pred taper , con't antihistamine for itching   Follow-up: Return if symptoms worsen or fail to improve.  Ann Held, DO

## 2016-08-12 NOTE — Progress Notes (Signed)
Pre visit review using our clinic review tool, if applicable. No additional management support is needed unless otherwise documented below in the visit note. 

## 2016-08-12 NOTE — Patient Instructions (Signed)
Contact Dermatitis Dermatitis is redness, soreness, and swelling (inflammation) of the skin. Contact dermatitis is a reaction to certain substances that touch the skin. You either touched something that irritated your skin, or you have allergies to something you touched. Follow these instructions at home: Skin Care  Moisturize your skin as needed.  Apply cool compresses to the affected areas.  Try taking a bath with: ? Epsom salts. Follow the instructions on the package. You can get these at a pharmacy or grocery store. ? Baking soda. Pour a small amount into the bath as told by your doctor. ? Colloidal oatmeal. Follow the instructions on the package. You can get this at a pharmacy or grocery store.  Try applying baking soda paste to your skin. Stir water into baking soda until it looks like paste.  Do not scratch your skin.  Bathe less often.  Bathe in lukewarm water. Avoid using hot water. Medicines  Take or apply over-the-counter and prescription medicines only as told by your doctor.  If you were prescribed an antibiotic medicine, take or apply your antibiotic as told by your doctor. Do not stop taking the antibiotic even if your condition starts to get better. General instructions  Keep all follow-up visits as told by your doctor. This is important.  Avoid the substance that caused your reaction. If you do not know what caused it, keep a journal to try to track what caused it. Write down: ? What you eat. ? What cosmetic products you use. ? What you drink. ? What you wear in the affected area. This includes jewelry.  If you were given a bandage (dressing), take care of it as told by your doctor. This includes when to change and remove it. Contact a doctor if:  You do not get better with treatment.  Your condition gets worse.  You have signs of infection such as: ? Swelling. ? Tenderness. ? Redness. ? Soreness. ? Warmth.  You have a fever.  You have new  symptoms. Get help right away if:  You have a very bad headache.  You have neck pain.  Your neck is stiff.  You throw up (vomit).  You feel very sleepy.  You see red streaks coming from the affected area.  Your bone or joint underneath the affected area becomes painful after the skin has healed.  The affected area turns darker.  You have trouble breathing. This information is not intended to replace advice given to you by your health care provider. Make sure you discuss any questions you have with your health care provider. Document Released: 10/28/2008 Document Revised: 06/08/2015 Document Reviewed: 05/18/2014 Elsevier Interactive Patient Education  2018 [employer] Inc[employer]  

## 2016-08-15 ENCOUNTER — Ambulatory Visit (HOSPITAL_BASED_OUTPATIENT_CLINIC_OR_DEPARTMENT_OTHER): Payer: BLUE CROSS/BLUE SHIELD

## 2016-08-15 ENCOUNTER — Other Ambulatory Visit (HOSPITAL_BASED_OUTPATIENT_CLINIC_OR_DEPARTMENT_OTHER): Payer: BLUE CROSS/BLUE SHIELD

## 2016-08-20 ENCOUNTER — Ambulatory Visit: Payer: BLUE CROSS/BLUE SHIELD | Admitting: Family

## 2016-08-27 ENCOUNTER — Ambulatory Visit (INDEPENDENT_AMBULATORY_CARE_PROVIDER_SITE_OTHER): Payer: BLUE CROSS/BLUE SHIELD | Admitting: Internal Medicine

## 2016-08-27 ENCOUNTER — Encounter: Payer: Self-pay | Admitting: Internal Medicine

## 2016-08-27 VITALS — BP 126/70 | HR 93 | Temp 98.0°F | Resp 14 | Ht 62.0 in | Wt 116.2 lb

## 2016-08-27 DIAGNOSIS — L247 Irritant contact dermatitis due to plants, except food: Secondary | ICD-10-CM | POA: Diagnosis not present

## 2016-08-27 MED ORDER — BETAMETHASONE DIPROPIONATE AUG 0.05 % EX CREA: TOPICAL_CREAM | Freq: Two times a day (BID) | CUTANEOUS | 0 refills | Status: DC

## 2016-08-27 MED ORDER — LIDOCAINE 4 % EX CREA: 1.0000 "application " | TOPICAL_CREAM | Freq: Two times a day (BID) | CUTANEOUS | 0 refills | Status: AC | PRN

## 2016-08-27 NOTE — Progress Notes (Signed)
Subjective:    Patient ID: Samantha Walker, female    DOB: 1955/10/26, 61 y.o.   MRN: [id number]  DOS:  08/27/2016 Type of visit - description : acute Interval history: Was seen 08/05/2016 with few days history of a itchy rash, Rx prednisone and Keflex (question of developing cellulitis). Was seen again 08/13/2011, was improving, was prescribed a second round of prednisone. As soon as she finished the medication, the itching came back. Actually, has developed a new rash at the neck which is very pruritic. Denies any new poison ivy contacts. Has wash all her clothing  Review of Systems  No fever chills Past Medical History:  Diagnosis Date  . Allergy   . Arthritis   . History of chicken pox   . Hx of adenomatous polyp of colon 06/25/2014  . Hypertension   . Osteopenia 06/30/2012    Past Surgical History:  Procedure Laterality Date  . CESAREAN SECTION  1980  . HAND SURGERY  2007   carpal tunel release-left hand  . HAND SURGERY  2007   carpal tunel release--right hand    Social History   Social History  . Marital status: Married    Spouse name: N/A  . Number of children: N/A  . Years of education: N/A   Occupational History  . Not on file.   Social History Main Topics  . Smoking status: Current Some Day Smoker    Types: Cigarettes    Last attempt to quit: 01/27/2012  . Smokeless tobacco: Never Used     Comment: 0-2 cigarettes a day  . Alcohol use No  . Drug use: Yes    Types: Marijuana     Comment: occasional  . Sexual activity: Not on file   Other Topics Concern  . Not on file   Social History Narrative   Rare use of marijuana   Works as Training and development officer at [employer]   She has 2 grown sons- 4 grandchildren   Married   Completed 7th grade.              Allergies as of 08/27/2016   No Known Allergies     Medication List       Accurate as of 08/27/16 11:59 PM. Always use your most recent med list.          acetaminophen 500 MG tablet Commonly  known as:  TYLENOL Take 1,000-1,500 mg by mouth every 6 (six) hours as needed (for pain).   aspirin 81 MG tablet Take 81 mg by mouth daily. Reported on 03/06/2015   augmented betamethasone dipropionate 0.05 % cream Commonly known as:  DIPROLENE-AF Apply topically 2 (two) times daily.   Calcium Carbonate-Vitamin D 600-400 MG-UNIT tablet Take 1 tablet by mouth daily.   lidocaine 4 % cream Commonly known as:  LMX Apply 1 application topically 2 (two) times daily as needed.   lisinopril-hydrochlorothiazide 10-12.5 MG tablet Commonly known as:  PRINZIDE,ZESTORETIC Take 1 tablet by mouth daily.   meloxicam 7.5 MG tablet Commonly known as:  MOBIC Take 1 tablet (7.5 mg total) by mouth daily.   ONE-A-DAY WOMENS FORMULA PO Take 1 tablet by mouth daily.   potassium chloride SA 20 MEQ tablet Commonly known as:  K-DUR,KLOR-CON Take 1 tablet (20 mEq total) by mouth daily.          Objective:   Physical Exam  Skin:      BP 126/70 (BP Location: Left Arm, Patient Position: Sitting, Cuff Size: Small)  Pulse 93   Temp 98 F (36.7 C) (Oral)   Resp 14   Ht 5\' 2"  (1.575 m)   Wt 116 lb 4 oz (52.7 kg)   SpO2 96%   BMI 21.26 kg/m  General:   Well developed, well nourished . NAD.  HEENT:  Normocephalic . Face symmetric, atraumatic Skin: Has several area w/  hard blisters. See picture for representative rash. Neurologic:  alert & oriented X3.  Speech normal, gait appropriate for age and unassisted Psych--  Cognition and judgment appear intact.  Cooperative with normal attention span and concentration.  Behavior appropriate. No anxious or depressed appearing.        Assessment & Plan:   Contact dermatitis: On exam, rash seems to be drying up but is still very pruritic.  She already had 2 rounds of oral steroids. Plan: Topical steroids, topical lidocaine if the steroids fail to help. Continue with Benadryl night, Claritin in the morning. Be sure all his clothing has  been washed. Call if not gradually improving, will consider a third round of steroids only  if strictly necessary.

## 2016-08-27 NOTE — Progress Notes (Signed)
Pre visit review using our clinic review tool, if applicable. No additional management support is needed unless otherwise documented below in the visit note. 

## 2016-08-27 NOTE — Patient Instructions (Addendum)
Apply DIPROLENE twice a day at the areas each the most  Apply lidocaine twice a day as well as needed  For itching: Claritin 10 mg one tablet daily in the morning. Over-the-counter Benadryl at bedtime   Call if not gradually improving

## 2016-09-01 ENCOUNTER — Other Ambulatory Visit: Payer: Self-pay | Admitting: Family

## 2016-09-01 DIAGNOSIS — E876 Hypokalemia: Secondary | ICD-10-CM

## 2016-09-25 ENCOUNTER — Ambulatory Visit (INDEPENDENT_AMBULATORY_CARE_PROVIDER_SITE_OTHER): Payer: BLUE CROSS/BLUE SHIELD | Admitting: Family

## 2016-09-25 ENCOUNTER — Encounter: Payer: Self-pay | Admitting: Family

## 2016-09-25 VITALS — BP 136/73 | HR 98 | Temp 98.3°F | Resp 16 | Ht 62.0 in | Wt 114.6 lb

## 2016-09-25 DIAGNOSIS — R21 Rash and other nonspecific skin eruption: Secondary | ICD-10-CM | POA: Diagnosis not present

## 2016-09-25 MED ORDER — CLOTRIMAZOLE-BETAMETHASONE 1-0.05 % EX CREA: 1.0000 "application " | TOPICAL_CREAM | Freq: Two times a day (BID) | CUTANEOUS | 1 refills | Status: DC

## 2016-09-25 NOTE — Patient Instructions (Signed)
Please apply lotrisone twice daily.   Continue cetaphil ointment after you bath.  You will be contacted about your referral to dermatology- let us know if you have not been contacted in 1 week.  Call if new/worsening symptoms between now and your appointment with dermatology.

## 2016-09-25 NOTE — Progress Notes (Signed)
Subjective:    Patient ID: Samantha Walker, female    DOB: 08-16-1955, 61 y.o.   MRN: [id number]  HPI   [name] is a 61 yr old female who presents today with chief complaint of rash. Reporst that rash started 5 days ago.  She reports that the rash is pruritic at times. She has tried otc hydrocortisone cream and benadryl without improvement. Intially she had improvement with diprolene and lidocaine, but rash returned.     Review of Systems   See HPI  Past Medical History:  Diagnosis Date  . Allergy   . Arthritis   . History of chicken pox   . Hx of adenomatous polyp of colon 06/25/2014  . Hypertension   . Osteopenia 06/30/2012     Social History   Social History  . Marital status: Married    Spouse name: N/A  . Number of children: N/A  . Years of education: N/A   Occupational History  . Not on file.   Social History Main Topics  . Smoking status: Current Some Day Smoker    Types: Cigarettes    Last attempt to quit: 01/27/2012  . Smokeless tobacco: Never Used     Comment: 0-2 cigarettes a day  . Alcohol use No  . Drug use: Yes    Types: Marijuana     Comment: occasional  . Sexual activity: Not on file   Other Topics Concern  . Not on file   Social History Narrative   Rare use of marijuana   Works as Training and development officer at [employer]   She has 2 grown sons- 4 grandchildren   Married   Completed 7th grade.            Past Surgical History:  Procedure Laterality Date  . CESAREAN SECTION  1980  . HAND SURGERY  2007   carpal tunel release-left hand  . HAND SURGERY  2007   carpal tunel release--right hand    Family History  Problem Relation Age of Onset  . Arthritis Mother   . Cancer Mother        breast age 81  . Hypertension Mother   . Seizures Mother   . Breast cancer Mother   . Hypertension Father   . Cancer Sister 23       breast   . Hypertension Sister   . Breast cancer Sister   . Hypertension Paternal Uncle   . Parkinson's disease Maternal  Grandmother   . Colon cancer Neg Hx   . Rectal cancer Neg Hx   . Stomach cancer Neg Hx   . Esophageal cancer Neg Hx     No Known Allergies  Current Outpatient Prescriptions on File Prior to Visit  Medication Sig Dispense Refill  . acetaminophen (TYLENOL) 500 MG tablet Take 1,000-1,500 mg by mouth every 6 (six) hours as needed (for pain).    Marland Kitchen aspirin 81 MG tablet Take 81 mg by mouth daily. Reported on 03/06/2015    . augmented betamethasone dipropionate (DIPROLENE-AF) 0.05 % cream Apply topically 2 (two) times daily. 60 g 0  . Calcium Carbonate-Vitamin D 600-400 MG-UNIT per tablet Take 1 tablet by mouth daily.    Marland Kitchen lisinopril-hydrochlorothiazide (PRINZIDE,ZESTORETIC) 10-12.5 MG tablet Take 1 tablet by mouth daily. 90 tablet 1  . meloxicam (MOBIC) 7.5 MG tablet Take 1 tablet (7.5 mg total) by mouth daily. 30 tablet 3  . potassium chloride SA (K-DUR,KLOR-CON) 20 MEQ tablet Take 1 tablet (20 mEq total) by mouth  daily. 90 tablet 1  . lidocaine (LMX) 4 % cream Apply 1 application topically 2 (two) times daily as needed. (Patient not taking: Reported on 09/25/2016) 30 g 0  . Multiple Vitamins-Calcium (ONE-A-DAY WOMENS FORMULA PO) Take 1 tablet by mouth daily.     No current facility-administered medications on file prior to visit.     BP 136/73 (BP Location: Right Arm, Cuff Size: Normal)   Pulse 98   Temp 98.3 F (36.8 C) (Oral)   Resp 16   Ht 5\' 2"  (1.575 m)   Wt 114 lb 9.6 oz (52 kg)   SpO2 100%   BMI 20.96 kg/m        Objective:   Physical Exam  Constitutional: She is oriented to person, place, and time. She appears well-developed and well-nourished.  Cardiovascular: Normal rate, regular rhythm and normal heart sounds.   No murmur heard. Pulmonary/Chest: Effort normal and breath sounds normal. No respiratory distress. She has no wheezes.  Musculoskeletal: She exhibits no edema.  Neurological: She is alert and oriented to person, place, and time.  Skin: Skin is warm and dry.    Multiple dry raised patches noted on hands and bilateral arms  Psychiatric: She has a normal mood and affect. Her behavior is normal. Judgment and thought content normal.             Assessment & Plan:  Skin rash- ? Eczema/? fungal.  I will also obtain an ANA to evaluate for discoid lupus.  Trial of lotrisone to cover for fungal etiology. Refer to dermatology.

## 2016-09-26 LAB — ANA: ANA: NEGATIVE

## 2016-09-27 ENCOUNTER — Encounter: Payer: Self-pay | Admitting: Family

## 2016-10-01 ENCOUNTER — Encounter: Payer: Self-pay | Admitting: Family

## 2016-10-09 ENCOUNTER — Other Ambulatory Visit: Payer: Self-pay | Admitting: Family

## 2016-10-09 DIAGNOSIS — E876 Hypokalemia: Secondary | ICD-10-CM

## 2018-03-04 ENCOUNTER — Emergency Department (HOSPITAL_COMMUNITY)
Admission: EM | Admit: 2018-03-04 | Discharge: 2018-03-05 | Disposition: A | Discharge status: Home / Self Care | Payer: BLUE CROSS/BLUE SHIELD | Attending: Emergency Medicine | Admitting: Emergency Medicine

## 2018-03-04 ENCOUNTER — Emergency Department (HOSPITAL_COMMUNITY): Payer: BLUE CROSS/BLUE SHIELD

## 2018-03-04 ENCOUNTER — Other Ambulatory Visit: Payer: Self-pay

## 2018-03-04 DIAGNOSIS — R112 Nausea with vomiting, unspecified: Secondary | ICD-10-CM

## 2018-03-04 DIAGNOSIS — I10 Essential (primary) hypertension: Secondary | ICD-10-CM | POA: Insufficient documentation

## 2018-03-04 DIAGNOSIS — J111 Influenza due to unidentified influenza virus with other respiratory manifestations: Secondary | ICD-10-CM | POA: Diagnosis not present

## 2018-03-04 DIAGNOSIS — Z7982 Long term (current) use of aspirin: Secondary | ICD-10-CM | POA: Insufficient documentation

## 2018-03-04 DIAGNOSIS — F17210 Nicotine dependence, cigarettes, uncomplicated: Secondary | ICD-10-CM | POA: Insufficient documentation

## 2018-03-04 DIAGNOSIS — B349 Viral infection, unspecified: Secondary | ICD-10-CM

## 2018-03-04 DIAGNOSIS — R6889 Other general symptoms and signs: Secondary | ICD-10-CM

## 2018-03-04 DIAGNOSIS — R509 Fever, unspecified: Secondary | ICD-10-CM | POA: Diagnosis not present

## 2018-03-04 DIAGNOSIS — Z79899 Other long term (current) drug therapy: Secondary | ICD-10-CM | POA: Insufficient documentation

## 2018-03-04 LAB — CBC WITH DIFFERENTIAL/PLATELET
Abs Immature Granulocytes: 0.11 10*3/uL — ABNORMAL HIGH (ref 0.00–0.07)
Basophils Absolute: 0 10*3/uL (ref 0.0–0.1)
Basophils Relative: 0 %
Eosinophils Absolute: 0.1 10*3/uL (ref 0.0–0.5)
Eosinophils Relative: 0 %
HCT: 42.7 % (ref 36.0–46.0)
Hemoglobin: 13.9 g/dL (ref 12.0–15.0)
Immature Granulocytes: 1 %
Lymphocytes Relative: 7 %
Lymphs Abs: 1.2 10*3/uL (ref 0.7–4.0)
MCH: 31.9 pg (ref 26.0–34.0)
MCHC: 32.6 g/dL (ref 30.0–36.0)
MCV: 97.9 fL (ref 80.0–100.0)
Monocytes Absolute: 1.1 10*3/uL — ABNORMAL HIGH (ref 0.1–1.0)
Monocytes Relative: 6 %
Neutro Abs: 15.9 10*3/uL — ABNORMAL HIGH (ref 1.7–7.7)
Neutrophils Relative %: 86 %
Platelets: 208 10*3/uL (ref 150–400)
RBC: 4.36 MIL/uL (ref 3.87–5.11)
RDW: 14 % (ref 11.5–15.5)
WBC: 18.3 10*3/uL — ABNORMAL HIGH (ref 4.0–10.5)
nRBC: 0 % (ref 0.0–0.2)

## 2018-03-04 LAB — COMPREHENSIVE METABOLIC PANEL
ALT: 12 U/L (ref 0–44)
AST: 21 U/L (ref 15–41)
Albumin: 4.2 g/dL (ref 3.5–5.0)
Alkaline Phosphatase: 56 U/L (ref 38–126)
Anion gap: 11 (ref 5–15)
BILIRUBIN TOTAL: 1.1 mg/dL (ref 0.3–1.2)
BUN: 6 mg/dL — ABNORMAL LOW (ref 8–23)
CO2: 23 mmol/L (ref 22–32)
Calcium: 9.4 mg/dL (ref 8.9–10.3)
Chloride: 104 mmol/L (ref 98–111)
Creatinine, Ser: 0.74 mg/dL (ref 0.44–1.00)
GFR calc Af Amer: >60 mL/min (ref >60)
GFR calc non Af Amer: >60 mL/min (ref >60)
Glucose, Bld: 114 mg/dL — ABNORMAL HIGH (ref 70–99)
POTASSIUM: 3.4 mmol/L — AB (ref 3.5–5.1)
Sodium: 138 mmol/L (ref 135–145)
TOTAL PROTEIN: 7.2 g/dL (ref 6.5–8.1)

## 2018-03-04 MED ORDER — ACETAMINOPHEN 325 MG PO TABS
650.0000 mg | ORAL_TABLET | Freq: Once | ORAL | Status: AC | PRN
  Administered 2018-03-04: 650 mg via ORAL
  Filled 2018-03-04: qty 2

## 2018-03-04 MED ORDER — ONDANSETRON 4 MG PO TBDP
4.0000 mg | ORAL_TABLET | Freq: Once | ORAL | Status: AC | PRN
  Administered 2018-03-04: 4 mg via ORAL
  Filled 2018-03-04: qty 1

## 2018-03-04 MED ORDER — SODIUM CHLORIDE 0.9% FLUSH
3.0000 mL | Freq: Once | INTRAVENOUS | Status: AC
  Administered 2018-03-05: 3 mL via INTRAVENOUS

## 2018-03-04 NOTE — ED Triage Notes (Signed)
Patient c/o weakness and body aches x1 week. Began with N/V/D today.

## 2018-03-05 ENCOUNTER — Encounter (HOSPITAL_COMMUNITY): Payer: Self-pay | Admitting: Radiology

## 2018-03-05 ENCOUNTER — Emergency Department (HOSPITAL_COMMUNITY): Payer: BLUE CROSS/BLUE SHIELD

## 2018-03-05 LAB — INFLUENZA PANEL BY PCR (TYPE A & B)
Influenza A By PCR: NEGATIVE
Influenza B By PCR: NEGATIVE

## 2018-03-05 LAB — URINALYSIS, ROUTINE W REFLEX MICROSCOPIC
Bacteria, UA: NONE SEEN
Bilirubin Urine: NEGATIVE
Glucose, UA: NEGATIVE mg/dL
Ketones, ur: 80 mg/dL — AB
Leukocytes,Ua: NEGATIVE
Nitrite: NEGATIVE
Protein, ur: 30 mg/dL — AB
SPECIFIC GRAVITY, URINE: 1.026 (ref 1.005–1.030)
pH: 7 (ref 5.0–8.0)

## 2018-03-05 LAB — LACTIC ACID, PLASMA: Lactic Acid, Venous: 0.7 mmol/L (ref 0.5–1.9)

## 2018-03-05 MED ORDER — MORPHINE SULFATE (PF) 4 MG/ML IV SOLN
4.0000 mg | Freq: Once | INTRAVENOUS | Status: AC
  Administered 2018-03-05: 4 mg via INTRAVENOUS
  Filled 2018-03-05: qty 1

## 2018-03-05 MED ORDER — IOHEXOL 300 MG/ML  SOLN
80.0000 mL | Freq: Once | INTRAMUSCULAR | Status: AC | PRN
  Administered 2018-03-05: 80 mL via INTRAVENOUS

## 2018-03-05 MED ORDER — ACETAMINOPHEN 325 MG PO TABS
650.0000 mg | ORAL_TABLET | Freq: Once | ORAL | Status: AC | PRN
  Administered 2018-03-05: 650 mg via ORAL
  Filled 2018-03-05: qty 2

## 2018-03-05 MED ORDER — KETOROLAC TROMETHAMINE 30 MG/ML IJ SOLN
30.0000 mg | Freq: Once | INTRAMUSCULAR | Status: AC
  Administered 2018-03-05: 30 mg via INTRAVENOUS
  Filled 2018-03-05: qty 1

## 2018-03-05 MED ORDER — ONDANSETRON HCL 4 MG/2ML IJ SOLN
4.0000 mg | Freq: Once | INTRAMUSCULAR | Status: AC
  Administered 2018-03-05: 4 mg via INTRAVENOUS
  Filled 2018-03-05: qty 2

## 2018-03-05 MED ORDER — ONDANSETRON 4 MG PO TBDP: 4.0000 mg | ORAL_TABLET | Freq: Three times a day (TID) | ORAL | 0 refills | Status: DC | PRN

## 2018-03-05 MED ORDER — DICYCLOMINE HCL 10 MG PO CAPS: 10.0000 mg | ORAL_CAPSULE | Freq: Three times a day (TID) | ORAL | 0 refills | Status: AC | PRN

## 2018-03-05 MED ORDER — SODIUM CHLORIDE 0.9 % IV BOLUS
1500.0000 mL | Freq: Once | INTRAVENOUS | Status: AC
  Administered 2018-03-05: 1000 mL via INTRAVENOUS

## 2018-03-05 NOTE — Discharge Instructions (Addendum)
Your imaging and blood work in the emergency department was generally reassuring.  We believe that your symptoms are due to a viral/flu-like illness.  We recommend 650 mg Tylenol every 6 hours for fever.  You may take this with 400 mg ibuprofen/Motrin every 6 hours for management of persistent fever and/or abdominal pain.  Continue use of Zofran for management of nausea and vomiting.  Drink plenty of clear liquids to prevent dehydration.  Should you experience persistent abdominal pain or cramping, you may take Bentyl as prescribed.  Follow-up with your primary care doctor in 1 to 2 days to ensure that symptoms are improving.  Should you experience worsening symptoms such as increased abdominal pain, inability to control nausea or vomiting, persistent fever over 100.4 F despite use of medications, or other concerning symptoms -promptly return to the ED for repeat evaluation.  You have blood cultures pending today.  You will be notified if these return positive.

## 2018-03-05 NOTE — ED Notes (Signed)
Fluid tolerated well. 

## 2018-03-05 NOTE — ED Provider Notes (Signed)
[facility] EMERGENCY DEPARTMENT Provider Note   CSN: [id number] Arrival date & time: 03/04/18  2034    History   Chief Complaint Chief Complaint  Patient presents with  . N/V/D  . Generalized Body Aches    HPI Samantha Walker is a 63 y.o. female.    63 year old female with no significant past medical history presents to the emergency department for nausea and vomiting.  Reports onset of nausea and vomiting today.  She has had 4 episodes of nonbloody, nonbilious emesis.  One episode of looser, non-watery stool.  Symptoms preceded by generalized weakness and body aches x1 week.  She also reports an associated discomfort across her low waist and into her back.  This radiates into her bilateral thighs.  She describes the discomfort as an aching sensation.  Was noted to be febrile in triage for which she received Tylenol.  No additional medications prior to arrival.  She denies any dysuria, urinary frequency, urinary urgency.  No new or worsening SOB, cough, congestion.  No sick contacts.  Abdominal SHx significant for C-section x 1.  The history is provided by the patient. No language interpreter was used.    Past Medical History:  Diagnosis Date  . Allergy   . Arthritis   . History of chicken pox   . Hx of adenomatous polyp of colon 06/25/2014  . Hypertension   . Osteopenia 06/30/2012    Patient Active Problem List   Diagnosis Date Noted  . Hx of adenomatous polyp of colon 06/25/2014  . Osteopenia 06/30/2012  . Hyperglycemia 03/11/2012  . Routine general medical examination at a health care facility 02/18/2012  . HTN (hypertension) 01/20/2012    Past Surgical History:  Procedure Laterality Date  . CESAREAN SECTION  1980  . HAND SURGERY  2007   carpal tunel release-left hand  . HAND SURGERY  2007   carpal tunel release--right hand     OB History   No obstetric history on file.      Home Medications    Prior to Admission medications   Medication  Sig Start Date End Date Taking? Authorizing Provider  aspirin 81 MG tablet Take 81 mg by mouth daily. Reported on 03/06/2015   Yes [provider]  Calcium Carbonate-Vitamin D 600-400 MG-UNIT per tablet Take 1 tablet by mouth daily. 06/30/12  Yes Debbrah Alar, NP  lisinopril-hydrochlorothiazide (PRINZIDE,ZESTORETIC) 10-12.5 MG tablet Take 1 tablet by mouth daily. 08/05/16  Yes Debbrah Alar, NP  Multiple Vitamins-Calcium (ONE-A-DAY WOMENS FORMULA PO) Take 1 tablet by mouth daily.   Yes [provider]  potassium chloride SA (K-DUR,KLOR-CON) 20 MEQ tablet Take 1 tablet (20 mEq total) by mouth daily. 08/05/16  Yes Debbrah Alar, NP  dicyclomine (BENTYL) 10 MG capsule Take 1 capsule (10 mg total) by mouth every 8 (eight) hours as needed (for abdominal pain/cramping). 03/05/18   Antonietta Breach, PA-C  lidocaine (LMX) 4 % cream Apply 1 application topically 2 (two) times daily as needed. Patient not taking: Reported on 09/25/2016 08/27/16   Colon Branch, MD  ondansetron (ZOFRAN ODT) 4 MG disintegrating tablet Take 1 tablet (4 mg total) by mouth every 8 (eight) hours as needed for nausea or vomiting. 03/05/18   Antonietta Breach, PA-C    Family History Family History  Problem Relation Age of Onset  . Arthritis Mother   . Cancer Mother        breast age 41  . Hypertension Mother   . Seizures Mother   .  Breast cancer Mother   . Hypertension Father   . Cancer Sister 4       breast   . Hypertension Sister   . Breast cancer Sister   . Hypertension Paternal Uncle   . Parkinson's disease Maternal Grandmother   . Colon cancer Neg Hx   . Rectal cancer Neg Hx   . Stomach cancer Neg Hx   . Esophageal cancer Neg Hx     Social History Social History   Tobacco Use  . Smoking status: Current Some Day Smoker    Types: Cigarettes    Last attempt to quit: 01/27/2012    Years since quitting: 6.1  . Smokeless tobacco: Never Used  . Tobacco comment: 0-2 cigarettes a day    Substance Use Topics  . Alcohol use: No    Alcohol/week: 0.0 standard drinks  . Drug use: Yes    Types: Marijuana    Comment: occasional     Allergies   Patient has no known allergies.   Review of Systems Review of Systems Ten systems reviewed and are negative for acute change, except as noted in the HPI.    Physical Exam Updated Vital Signs BP 135/73 (BP Location: Right Arm)   Pulse 73   Temp 99.8 F (37.7 C) (Oral)   Resp 18   Ht 5\' 2"  (1.575 m)   Wt 52.2 kg   SpO2 100%   BMI 21.03 kg/m   Physical Exam Vitals signs and nursing note reviewed.  Constitutional:      General: She is not in acute distress.    Appearance: She is well-developed. She is not diaphoretic.     Comments: Appears fatigued, but nontoxic. Pleasant demeanor.  HENT:     Head: Normocephalic and atraumatic.  Eyes:     General: No scleral icterus.    Conjunctiva/sclera: Conjunctivae normal.  Neck:     Musculoskeletal: Normal range of motion.  Cardiovascular:     Rate and Rhythm: Normal rate and regular rhythm.     Pulses: Normal pulses.  Pulmonary:     Effort: Pulmonary effort is normal. No respiratory distress.     Breath sounds: No stridor. No wheezing or rales.     Comments: Lungs CTAB. Respirations even and unlabored. Abdominal:     Palpations: Abdomen is soft. There is no mass.     Tenderness: There is no guarding.     Comments: Abdomen soft, nondistended. Minimal RLQ TTP, otherwise nontender. No guarding or peritoneal signs.  Musculoskeletal: Normal range of motion.  Skin:    General: Skin is warm and dry.     Coloration: Skin is not pale.     Findings: No erythema or rash.  Neurological:     Mental Status: She is alert and oriented to person, place, and time.     Coordination: Coordination normal.     Comments: GCS 15.  Speech is goal oriented.  Answers questions appropriately and follows commands.  Moving all extremities spontaneously.  Psychiatric:        Behavior: Behavior  normal.      ED Treatments / Results  Labs (all labs ordered are listed, but only abnormal results are displayed) Labs Reviewed  COMPREHENSIVE METABOLIC PANEL - Abnormal; Notable for the following components:      Result Value   Potassium 3.4 (*)    Glucose, Bld 114 (*)    BUN 6 (*)    All other components within normal limits  CBC WITH DIFFERENTIAL/PLATELET - Abnormal; Notable  for the following components:   WBC 18.3 (*)    Neutro Abs 15.9 (*)    Monocytes Absolute 1.1 (*)    Abs Immature Granulocytes 0.11 (*)    All other components within normal limits  URINALYSIS, ROUTINE W REFLEX MICROSCOPIC - Abnormal; Notable for the following components:   Hgb urine dipstick SMALL (*)    Ketones, ur 80 (*)    Protein, ur 30 (*)    All other components within normal limits  CULTURE, BLOOD (ROUTINE X 2)  CULTURE, BLOOD (ROUTINE X 2)  URINE CULTURE  LACTIC ACID, PLASMA  INFLUENZA PANEL BY PCR (TYPE A & B)    EKG None  Radiology Dg Chest 2 View  Result Date: 03/04/2018 CLINICAL DATA:  Cough EXAM: CHEST - 2 VIEW COMPARISON:  03/06/2015 FINDINGS: The heart size and mediastinal contours are within normal limits. Both lungs are clear. The visualized skeletal structures are unremarkable. IMPRESSION: No active cardiopulmonary disease. Electronically Signed   By: Donavan Foil M.D.   On: 03/04/2018 21:43   Ct Abdomen Pelvis W Contrast  Result Date: 03/05/2018 CLINICAL DATA:  Abdominal pain EXAM: CT ABDOMEN AND PELVIS WITH CONTRAST TECHNIQUE: Multidetector CT imaging of the abdomen and pelvis was performed using the standard protocol following bolus administration of intravenous contrast. CONTRAST:  35mL OMNIPAQUE IOHEXOL 300 MG/ML  SOLN COMPARISON:  None. FINDINGS: Lower chest:  No contributory findings. Hepatobiliary: Scattered tiny low densities in the liver, too small for densitometry. There is no history of malignancy and given their well-defined low-density appearance for size on coronal  reformats are likely cysts.No evidence of biliary obstruction or stone. Pancreas: Unremarkable. Spleen: Unremarkable. Adrenals/Urinary Tract: Negative adrenals. No hydronephrosis or stone. Unremarkable bladder. Stomach/Bowel:  No obstruction. No appendicitis. Vascular/Lymphatic: No acute vascular abnormality. Extensive atherosclerotic calcification of the aorta and iliacs with at least moderate left common iliac artery narrowing. No mass or adenopathy. Reproductive:No pathologic findings. Other: Trace pelvic fluid from uncertain source Musculoskeletal: No acute abnormalities.  Lower lumbar disc bulging. IMPRESSION: 1. No specific cause of abdominal pain. There is trace pelvic fluid without evidence of underlying inflammatory process. 2. Atherosclerosis. Electronically Signed   By: Monte Fantasia M.D.   On: 03/05/2018 05:26    Procedures Procedures (including critical care time)  Medications Ordered in ED Medications  acetaminophen (TYLENOL) tablet 650 mg (650 mg Oral Given 03/04/18 2047)  sodium chloride flush (NS) 0.9 % injection 3 mL (3 mLs Intravenous Given 03/05/18 0249)  ondansetron (ZOFRAN-ODT) disintegrating tablet 4 mg (4 mg Oral Given 03/04/18 2050)  sodium chloride 0.9 % bolus 1,500 mL (0 mLs Intravenous Stopped 03/05/18 0519)  acetaminophen (TYLENOL) tablet 650 mg (650 mg Oral Given 03/05/18 0244)  morphine 4 MG/ML injection 4 mg (4 mg Intravenous Given 03/05/18 0304)  ondansetron (ZOFRAN) injection 4 mg (4 mg Intravenous Given 03/05/18 0304)  iohexol (OMNIPAQUE) 300 MG/ML solution 80 mL (80 mLs Intravenous Contrast Given 03/05/18 0453)  ketorolac (TORADOL) 30 MG/ML injection 30 mg (30 mg Intravenous Given 03/05/18 0538)    5:45 AM Tolerated 2 bottle of oral contrast. Currently tolerating ginger ale. No pain following Toradol. States that she is feeling better. Visibly appears improved.   Initial Impression / Assessment and Plan / ED Course  I have reviewed the triage vital signs and the  nursing notes.  Pertinent labs & imaging results that were available during my care of the patient were reviewed by me and considered in my medical decision making (see chart for details).  63 year old female presents for fatigue and malaise with body aches x1 week.  Developed objective fever yesterday, noted to be febrile in the emergency department.  Fever responded appropriately to antipyretics.  The patient had onset of nausea and vomiting today.  This has been well controlled with Zofran.  Noting some discomfort in her lower abdomen and low back.  This improved with 1 dose of morphine and 1 dose of Toradol.  Aside from leukocytosis, the patient was noted to have reassuring labs.  Her lactate is normal.  No electrolyte derangements.  Liver and kidney function preserved.  Urinalysis does not suggest infection.  Given fever and leukocytosis with lower abdominal discomfort, nausea, vomiting, patient underwent CT abdomen and pelvis which shows mild amount of nonspecific pelvic free fluid.  Chest x-ray ordered in triage which shows no pneumonia, pneumothorax, pleural effusion.  Suspect leukocytosis may have been from persistent vomiting and dehydration prior to arrival.  Though her flu swab is negative, her symptoms remain consistent with flulike/viral etiology.  She has been able to tolerate more than 30 ounces of oral fluid since being given antiemetics.  On reassessment, she states that she is feeling much better.  No longer has any complaints of pain.  Patient encouraged to follow-up with her primary care doctor.  Recommended close follow-up in 24 to 48 hours given leukocytosis.  Patient discharged home with Zofran and Bentyl.  Return precautions discussed and provided. Patient discharged in stable condition with no unaddressed concerns.   Vitals:   03/05/18 0415 03/05/18 0430 03/05/18 0445 03/05/18 0515  BP: (!) 146/74 (!) 155/84 (!) 159/84 135/73  Pulse: 87 79 77 73  Resp:    18    Temp:      TempSrc:      SpO2: 99% 99% 99% 100%  Weight:      Height:        Final Clinical Impressions(s) / ED Diagnoses   Final diagnoses:  Viral syndrome  Febrile illness  Flu-like symptoms  Non-intractable vomiting with nausea, unspecified vomiting type    ED Discharge Orders         Ordered    ondansetron (ZOFRAN ODT) 4 MG disintegrating tablet  Every 8 hours PRN     03/05/18 0550    dicyclomine (BENTYL) 10 MG capsule  Every 8 hours PRN     03/05/18 0550           Antonietta Breach, PA-C 00/92/33 0076    Delora Fuel, MD 22/63/33 [phone]

## 2018-03-05 NOTE — ED Notes (Signed)
Fluid challenge started with ginger ale

## 2018-03-07 LAB — URINE CULTURE: Culture: 50000 — AB

## 2018-03-08 ENCOUNTER — Telehealth: Payer: Self-pay | Admitting: Emergency Medicine

## 2018-03-08 NOTE — Telephone Encounter (Signed)
Post ED Visit - Positive Culture Follow-up  Culture report reviewed by antimicrobial stewardship pharmacist: [place] Team []  Elenor Quinones, Pharm.D. []  Heide Guile, Pharm.D., BCPS AQ-ID []  Parks Neptune, Pharm.D., BCPS []  Alycia Rossetti, Pharm.D., BCPS []  [place], Pharm.D., BCPS, AAHIVP []  Legrand Como, Pharm.D., BCPS, AAHIVP []  Salome Arnt, PharmD, BCPS []  Johnnette Gourd, PharmD, BCPS []  Hughes Better, PharmD, BCPS [x]  Leeroy Cha, PharmD []  Laqueta Linden, PharmD, BCPS []  Albertina Parr, PharmD  [place] Team []  Leodis Sias, PharmD []  Lindell Spar, PharmD []  Royetta Asal, PharmD []  Graylin Shiver, Rph []  Rema Fendt) Glennon Mac, PharmD []  Arlyn Dunning, PharmD []  Netta Cedars, PharmD []  Dia Sitter, PharmD []  Leone [place], PharmD []  Gretta Arab, PharmD []  Theodis Shove, PharmD []  Peggyann Juba, PharmD []  Reuel Boom, PharmD   Positive urine culture Asymptomatic bacteruria vs contamination, no further patient follow-up is required at this time.  Larene Beach Billee Balcerzak 03/08/2018, 1:30 PM

## 2018-03-08 NOTE — Progress Notes (Signed)
ED Antimicrobial Stewardship Positive Culture Follow Up   Samantha Walker is an 63 y.o. female who presented to [facility] on 03/04/2018 with a chief complaint of  Chief Complaint  Patient presents with  . N/V/D  . Generalized Body Aches    Recent Results (from the past 720 hour(s))  Urine culture     Status: Abnormal   Collection Time: 03/05/18  1:49 AM  Result Value Ref Range Status   Specimen Description URINE, RANDOM  Final   Special Requests NONE  Final   Culture (A)  Final    50,000 COLONIES/mL STAPHYLOCOCCUS EPIDERMIDIS 30,000 COLONIES/mL LACTOBACILLUS SPECIES Standardized susceptibility testing for this organism is not available. Performed at [facility], [place] [address], [place], [place] [address]    Report Status 03/07/2018 FINAL  Final   Organism ID, Bacteria STAPHYLOCOCCUS EPIDERMIDIS (A)  Final      Susceptibility   Staphylococcus epidermidis - MIC*    CIPROFLOXACIN <=0.5 SENSITIVE Sensitive     GENTAMICIN <=0.5 SENSITIVE Sensitive     NITROFURANTOIN <=16 SENSITIVE Sensitive     OXACILLIN >=4 RESISTANT Resistant     TETRACYCLINE >=16 RESISTANT Resistant     VANCOMYCIN 1 SENSITIVE Sensitive     TRIMETH/SULFA <=10 SENSITIVE Sensitive     CLINDAMYCIN RESISTANT Resistant     RIFAMPIN <=0.5 SENSITIVE Sensitive     Inducible Clindamycin POSITIVE Resistant     * 50,000 COLONIES/mL STAPHYLOCOCCUS EPIDERMIDIS  Culture, blood (Routine X 2) w Reflex to ID Panel     Status: None (Preliminary result)   Collection Time: 03/05/18  2:05 AM  Result Value Ref Range Status   Specimen Description BLOOD LEFT ANTECUBITAL  Final   Special Requests   Final    BOTTLES DRAWN AEROBIC AND ANAEROBIC Blood Culture results may not be optimal due to an excessive volume of blood received in culture bottles Performed at [facility] [address], [place], [place] [address]    Culture NO GROWTH 3 DAYS  Final   Report Status PENDING  Incomplete  Culture, blood (Routine X 2) w  Reflex to ID Panel     Status: None (Preliminary result)   Collection Time: 03/05/18  2:15 AM  Result Value Ref Range Status   Specimen Description BLOOD RIGHT FOREARM  Final   Special Requests   Final    BOTTLES DRAWN AEROBIC AND ANAEROBIC Blood Culture results may not be optimal due to an excessive volume of blood received in culture bottles Performed at [facility] [address] [place],  [address]    Culture NO GROWTH 3 DAYS  Final   Report Status PENDING  Incomplete   Given no reported s/sx of UTI, culture result like reflects colonization or contamination. No treatment needed.  ED Provider: Shelby Dubin, PA-C  Mila Merry Gerarda Fraction, PharmD, [place] PGY2 Infectious Diseases Pharmacy Resident Phone: [phone] 03/08/2018, 9:23 AM

## 2018-03-10 LAB — CULTURE, BLOOD (ROUTINE X 2)
Culture: NO GROWTH
Culture: NO GROWTH

## 2021-08-01 ENCOUNTER — Encounter: Payer: Self-pay | Admitting: Internal Medicine

## 2023-02-21 ENCOUNTER — Emergency Department (HOSPITAL_COMMUNITY): Payer: No Typology Code available for payment source

## 2023-02-21 ENCOUNTER — Emergency Department (HOSPITAL_COMMUNITY)
Admission: EM | Admit: 2023-02-21 | Discharge: 2023-02-21 | Disposition: A | Discharge status: Home / Self Care | Payer: No Typology Code available for payment source | Attending: Emergency Medicine | Admitting: Emergency Medicine

## 2023-02-21 ENCOUNTER — Encounter (HOSPITAL_COMMUNITY): Payer: Self-pay

## 2023-02-21 DIAGNOSIS — I10 Essential (primary) hypertension: Secondary | ICD-10-CM | POA: Insufficient documentation

## 2023-02-21 DIAGNOSIS — R55 Syncope and collapse: Secondary | ICD-10-CM | POA: Insufficient documentation

## 2023-02-21 DIAGNOSIS — R110 Nausea: Secondary | ICD-10-CM | POA: Diagnosis not present

## 2023-02-21 DIAGNOSIS — Z79899 Other long term (current) drug therapy: Secondary | ICD-10-CM | POA: Insufficient documentation

## 2023-02-21 DIAGNOSIS — R531 Weakness: Secondary | ICD-10-CM | POA: Diagnosis present

## 2023-02-21 DIAGNOSIS — Z20822 Contact with and (suspected) exposure to covid-19: Secondary | ICD-10-CM | POA: Diagnosis not present

## 2023-02-21 DIAGNOSIS — R5381 Other malaise: Secondary | ICD-10-CM | POA: Insufficient documentation

## 2023-02-21 DIAGNOSIS — Z7982 Long term (current) use of aspirin: Secondary | ICD-10-CM | POA: Diagnosis not present

## 2023-02-21 LAB — CBC WITH DIFFERENTIAL/PLATELET
Abs Immature Granulocytes: 0.01 10*3/uL (ref 0.00–0.07)
Basophils Absolute: 0.1 10*3/uL (ref 0.0–0.1)
Basophils Relative: 1 %
Eosinophils Absolute: 0.1 10*3/uL (ref 0.0–0.5)
Eosinophils Relative: 2 %
HCT: 39.7 % (ref 36.0–46.0)
Hemoglobin: 13.4 g/dL (ref 12.0–15.0)
Immature Granulocytes: 0 %
Lymphocytes Relative: 34 %
Lymphs Abs: 1.5 10*3/uL (ref 0.7–4.0)
MCH: 32.6 pg (ref 26.0–34.0)
MCHC: 33.8 g/dL (ref 30.0–36.0)
MCV: 96.6 fL (ref 80.0–100.0)
Monocytes Absolute: 0.4 10*3/uL (ref 0.1–1.0)
Monocytes Relative: 10 %
Neutro Abs: 2.4 10*3/uL (ref 1.7–7.7)
Neutrophils Relative %: 53 %
Platelets: 271 10*3/uL (ref 150–400)
RBC: 4.11 MIL/uL (ref 3.87–5.11)
RDW: 13.2 % (ref 11.5–15.5)
WBC: 4.5 10*3/uL (ref 4.0–10.5)
nRBC: 0 % (ref 0.0–0.2)

## 2023-02-21 LAB — LIPASE, BLOOD: Lipase: 31 U/L (ref 11–51)

## 2023-02-21 LAB — COMPREHENSIVE METABOLIC PANEL
ALT: 17 U/L (ref 0–44)
AST: 25 U/L (ref 15–41)
Albumin: 4.2 g/dL (ref 3.5–5.0)
Alkaline Phosphatase: 57 U/L (ref 38–126)
Anion gap: 12 (ref 5–15)
BUN: 10 mg/dL (ref 8–23)
CO2: 24 mmol/L (ref 22–32)
Calcium: 9.8 mg/dL (ref 8.9–10.3)
Chloride: 101 mmol/L (ref 98–111)
Creatinine, Ser: 0.66 mg/dL (ref 0.44–1.00)
GFR, Estimated: >60 mL/min (ref >60)
Glucose, Bld: 88 mg/dL (ref 70–99)
Potassium: 3.5 mmol/L (ref 3.5–5.1)
Sodium: 137 mmol/L (ref 135–145)
Total Bilirubin: 1 mg/dL (ref 0.0–1.2)
Total Protein: 7.5 g/dL (ref 6.5–8.1)

## 2023-02-21 LAB — CBG MONITORING, ED: Glucose-Capillary: 108 mg/dL — ABNORMAL HIGH (ref 70–99)

## 2023-02-21 LAB — TROPONIN I (HIGH SENSITIVITY): Troponin I (High Sensitivity): 2 ng/L (ref <18)

## 2023-02-21 LAB — RESP PANEL BY RT-PCR (RSV, FLU A&B, COVID)  RVPGX2
Influenza A by PCR: NEGATIVE
Influenza B by PCR: NEGATIVE
Resp Syncytial Virus by PCR: NEGATIVE
SARS Coronavirus 2 by RT PCR: NEGATIVE

## 2023-02-21 MED ORDER — ONDANSETRON 4 MG PO TBDP: 4.0000 mg | ORAL_TABLET | Freq: Three times a day (TID) | ORAL | 0 refills | Status: AC | PRN

## 2023-02-21 NOTE — ED Triage Notes (Signed)
 Patient arrived with complaints of nausea, low appetite and insomnia over the last 5 days. Reports some upper abdominal pain and one syncopal episode on 02/16/2023.

## 2023-02-21 NOTE — Discharge Instructions (Signed)
 Your evaluation did not show any serious cause for your symptoms.  I suspect that you have had a viral infection which will need to run its course.  You need to make sure that you keep yourself well-hydrated.  If you have any new or concerning symptoms, please return to the emergency department.  Otherwise, follow-up with your primary care provider.

## 2023-02-21 NOTE — ED Provider Notes (Signed)
 [place] EMERGENCY DEPARTMENT AT [facility] Provider Note   CSN: [id number] Arrival date & time: 02/21/23  0243     History  Chief Complaint  Patient presents with   Loss of Consciousness    Samantha Walker is a 68 y.o. female.  The history is provided by the patient.  Loss of Consciousness She has history of hypertension comes in with 1 week of feeling weak and lightheaded.  She did have a syncopal episode 1 week ago.  She relates that when she is laying in bed, she feels like her heart races and she has to sit up for second.  There has been a cough which is productive of clear sputum.  She denies fever or chills.  She denies chest pain, heaviness, tightness, pressure.   Home Medications Prior to Admission medications   Medication Sig Start Date End Date Taking? Authorizing Provider  aspirin 81 MG tablet Take 81 mg by mouth daily. Reported on 03/06/2015    [provider]  Calcium  Carbonate-Vitamin D  600-400 MG-UNIT per tablet Take 1 tablet by mouth daily. 06/30/12   O'Sullivan, Melissa, NP  dicyclomine  (BENTYL ) 10 MG capsule Take 1 capsule (10 mg total) by mouth every 8 (eight) hours as needed (for abdominal pain/cramping). 03/05/18   Keith Sor, PA-C  lidocaine  (LMX) 4 % cream Apply 1 application topically 2 (two) times daily as needed. Patient not taking: Reported on 09/25/2016 08/27/16   Amon Aloysius BRAVO, MD  lisinopril -hydrochlorothiazide  (PRINZIDE ,ZESTORETIC ) 10-12.5 MG tablet Take 1 tablet by mouth daily. 08/05/16   Daryl Setter, NP  Multiple Vitamins-Calcium  (ONE-A-DAY WOMENS FORMULA PO) Take 1 tablet by mouth daily.    [provider]  ondansetron  (ZOFRAN  ODT) 4 MG disintegrating tablet Take 1 tablet (4 mg total) by mouth every 8 (eight) hours as needed for nausea or vomiting. 03/05/18   Keith Sor, PA-C  potassium chloride  SA (K-DUR,KLOR-CON ) 20 MEQ tablet Take 1 tablet (20 mEq total) by mouth daily. 08/05/16   O'Sullivan, Melissa, NP       Allergies    Patient has no known allergies.    Review of Systems   Review of Systems  Cardiovascular:  Positive for syncope.  All other systems reviewed and are negative.   Physical Exam Updated Vital Signs BP (!) 146/93   Pulse 79   Temp 98.2 F (36.8 C) (Oral)   Resp 18   SpO2 98%  Physical Exam Vitals and nursing note reviewed.   68 year old female, resting comfortably and in no acute distress. Vital signs are significant for mildly elevated blood pressure. Oxygen saturation is 98%, which is normal. Head is normocephalic and atraumatic. PERRLA, EOMI. Oropharynx is clear. Neck is nontender and supple without adenopathy or JVD. Back is nontender and there is no CVA tenderness. Lungs are clear without rales, wheezes, or rhonchi. Chest is nontender. Heart has regular rate and rhythm without murmur. Abdomen is soft, flat, nontender. Extremities have no cyanosis or edema, full range of motion is present. Skin is warm and dry without rash. Neurologic: Mental status is normal, cranial nerves are intact, moves all extremities equally.  ED Results / Procedures / Treatments   Labs (all labs ordered are listed, but only abnormal results are displayed) Labs Reviewed  CBG MONITORING, ED - Abnormal; Notable for the following components:      Result Value   Glucose-Capillary 108 (*)    All other components within normal limits  RESP PANEL BY RT-PCR (RSV, FLU A&B,  COVID)  RVPGX2  LIPASE, BLOOD  CBC WITH DIFFERENTIAL/PLATELET  COMPREHENSIVE METABOLIC PANEL  URINALYSIS, ROUTINE W REFLEX MICROSCOPIC  URINALYSIS, W/ REFLEX TO CULTURE (INFECTION SUSPECTED)  TROPONIN I (HIGH SENSITIVITY)    EKG ED ECG REPORT   Date: 02/21/2023  Rate: 76  Rhythm: normal sinus rhythm  QRS Axis: normal  Intervals: normal  ST/T Wave abnormalities: normal  Conduction Disutrbances:none  Narrative Interpretation: Normal ECG.  When compared with ECG of 04/23/2016, no significant changes are seen.   Old EKG Reviewed: unchanged  I have personally reviewed the EKG tracing and agree with the computerized printout as noted.  Radiology CT Head Wo Contrast Result Date: 02/21/2023 CLINICAL DATA:  68 year old female with recent abdominal pain, nausea, syncope. EXAM: CT HEAD WITHOUT CONTRAST TECHNIQUE: Contiguous axial images were obtained from the base of the skull through the vertex without intravenous contrast. RADIATION DOSE REDUCTION: This exam was performed according to the departmental dose-optimization program which includes automated exposure control, adjustment of the mA and/or kV according to patient size and/or use of iterative reconstruction technique. COMPARISON:  None Available. FINDINGS: Brain: Normal cerebral volume. No midline shift, ventriculomegaly, mass effect, evidence of mass lesion, intracranial hemorrhage or evidence of cortically based acute infarction. Faint bilateral basal ganglia vascular calcifications. Minimal to mild for age patchy white matter hypodensity, primarily in the periatrial and superior frontal gyrus regions. Vascular: No suspicious intracranial vascular hyperdensity. Calcified atherosclerosis at the skull base. Skull: Intact, negative. Sinuses/Orbits: Visualized paranasal sinuses and mastoids are clear. Other: Visualized orbits and scalp soft tissues are within normal limits. IMPRESSION: 1. No acute intracranial abnormality. 2. Mild for age cerebral white matter changes most commonly due to small vessel disease. Electronically Signed   By: VEAR Hurst M.D.   On: 02/21/2023 06:33   DG Chest Port 1 View Result Date: 02/21/2023 CLINICAL DATA:  68 year old female with syncope. EXAM: PORTABLE CHEST 1 VIEW COMPARISON:  Chest radiographs 03/04/2018 and earlier. FINDINGS: Portable AP semi upright view at 0609 hours. Large lung volumes but might be compatible with good inspiratory effort. Normal cardiac size and mediastinal contours. Visualized tracheal air column is within normal  limits. Allowing for portable technique the lungs are clear. No pneumothorax or pleural effusion. No acute osseous abnormality identified. Negative visible bowel gas. IMPRESSION: No acute cardiopulmonary abnormality. Electronically Signed   By: VEAR Hurst M.D.   On: 02/21/2023 06:16    Procedures Procedures  Cardiac monitor shows normal sinus rhythm, per my interpretation.  Medications Ordered in ED Medications - No data to display  ED Course/ Medical Decision Making/ A&P                                 Medical Decision Making Amount and/or Complexity of Data Reviewed Labs: ordered. Radiology: ordered.  Risk Prescription drug management.   Weakness and lightheadedness with 1 episode of syncope.  Consider occult infection including influenza or COVID-19, consider occult UTI, consider occult pneumonia.  Consider arrhythmia.  I have reviewed her electrocardiogram, and my interpretation is normal ECG and unchanged from prior.  I have reviewed laboratory tests available now, my interpretation is negative PCR for COVID-19, influenza, RSV.  I have reviewed her laboratory tests, and my interpretation is normal CBC including WBC in the low end suggesting possible viral illness, normal comprehensive metabolic panel, normal lipase, normal troponin, respiratory pathogen panel negative for COVID-19 and influenza and RSV.  CT of head shows no acute  intracranial abnormality, chest x-ray shows no acute cardiopulmonary abnormality.  I have independently viewed all of these images, and agree with the radiologist's interpretation.  At this point, I feel patient is suffering sequelae of a viral illness.  She is requesting something for nausea and I am giving her a prescription for ondansetron .  I have encouraged her to maintain adequate hydration, follow-up with primary care provider, return to the emergency department if she develops new or concerning symptoms.  Final Clinical Impression(s) / ED  Diagnoses Final diagnoses:  Malaise  Nausea    Rx / DC Orders ED Discharge Orders          Ordered    ondansetron  (ZOFRAN  ODT) 4 MG disintegrating tablet  Every 8 hours PRN        02/21/23 0746              Raford Lenis, MD 02/21/23 [phone]
